# Patient Record
Sex: Male | Born: 1945
Health system: Southern US, Community
[De-identification: ages and names within clinical notes are randomized; demographics above are authoritative.]

## PROBLEM LIST (undated history)

## (undated) DIAGNOSIS — Z79899 Other long term (current) drug therapy: Secondary | ICD-10-CM

## (undated) DIAGNOSIS — N189 Chronic kidney disease, unspecified: Secondary | ICD-10-CM

## (undated) DIAGNOSIS — J189 Pneumonia, unspecified organism: Secondary | ICD-10-CM

## (undated) DIAGNOSIS — Z87442 Personal history of urinary calculi: Secondary | ICD-10-CM

## (undated) DIAGNOSIS — R509 Fever, unspecified: Secondary | ICD-10-CM

## (undated) DIAGNOSIS — R739 Hyperglycemia, unspecified: Secondary | ICD-10-CM

## (undated) DIAGNOSIS — M1990 Unspecified osteoarthritis, unspecified site: Secondary | ICD-10-CM

## (undated) DIAGNOSIS — E7889 Other lipoprotein metabolism disorders: Secondary | ICD-10-CM

## (undated) DIAGNOSIS — N400 Enlarged prostate without lower urinary tract symptoms: Secondary | ICD-10-CM

## (undated) HISTORY — DX: Benign prostatic hyperplasia without lower urinary tract symptoms: N40.0

## (undated) HISTORY — DX: Chronic kidney disease, unspecified: N18.9

## (undated) HISTORY — DX: Other long term (current) drug therapy: Z79.899

## (undated) HISTORY — PX: COLONOSCOPY: SHX174

## (undated) HISTORY — DX: Other lipoprotein metabolism disorders: E78.89

## (undated) HISTORY — DX: Hyperglycemia, unspecified: R73.9

## (undated) HISTORY — DX: Pneumonia, unspecified organism: J18.9

## (undated) HISTORY — DX: Fever, unspecified: R50.9

## (undated) MED FILL — Fosaprepitant Dimeglumine For IV Infusion 150 MG (Base Eq): INTRAVENOUS | Qty: 5 | Status: AC

## (undated) MED FILL — Fluorouracil IV Soln 5 GM/100ML (50 MG/ML): INTRAVENOUS | Qty: 148 | Status: AC

## (undated) MED FILL — Dexamethasone Sodium Phosphate Inj 100 MG/10ML: INTRAMUSCULAR | Qty: 1 | Status: AC

## (undated) MED FILL — Pembrolizumab IV Soln 100 MG/4ML (25 MG/ML): INTRAVENOUS | Qty: 8 | Status: AC

## (undated) MED FILL — Carboplatin IV Soln 600 MG/60ML: INTRAVENOUS | Qty: 45 | Status: AC

---

## 1967-03-10 HISTORY — PX: SHOULDER SURGERY: SHX246

## 2015-05-22 DIAGNOSIS — L299 Pruritus, unspecified: Secondary | ICD-10-CM | POA: Diagnosis not present

## 2015-05-22 DIAGNOSIS — L300 Nummular dermatitis: Secondary | ICD-10-CM | POA: Diagnosis not present

## 2016-02-27 DIAGNOSIS — Z6821 Body mass index (BMI) 21.0-21.9, adult: Secondary | ICD-10-CM | POA: Diagnosis not present

## 2016-02-27 DIAGNOSIS — R7309 Other abnormal glucose: Secondary | ICD-10-CM | POA: Diagnosis not present

## 2016-02-27 DIAGNOSIS — R6889 Other general symptoms and signs: Secondary | ICD-10-CM | POA: Diagnosis not present

## 2016-02-27 DIAGNOSIS — Z125 Encounter for screening for malignant neoplasm of prostate: Secondary | ICD-10-CM | POA: Diagnosis not present

## 2016-02-27 DIAGNOSIS — Z9181 History of falling: Secondary | ICD-10-CM | POA: Diagnosis not present

## 2016-02-27 DIAGNOSIS — J189 Pneumonia, unspecified organism: Secondary | ICD-10-CM | POA: Diagnosis not present

## 2016-03-05 DIAGNOSIS — J301 Allergic rhinitis due to pollen: Secondary | ICD-10-CM | POA: Diagnosis not present

## 2016-03-05 DIAGNOSIS — R7309 Other abnormal glucose: Secondary | ICD-10-CM | POA: Diagnosis not present

## 2016-03-05 DIAGNOSIS — Z6821 Body mass index (BMI) 21.0-21.9, adult: Secondary | ICD-10-CM | POA: Diagnosis not present

## 2016-03-05 DIAGNOSIS — E7889 Other lipoprotein metabolism disorders: Secondary | ICD-10-CM | POA: Diagnosis not present

## 2016-03-05 DIAGNOSIS — L309 Dermatitis, unspecified: Secondary | ICD-10-CM | POA: Diagnosis not present

## 2016-03-05 DIAGNOSIS — J019 Acute sinusitis, unspecified: Secondary | ICD-10-CM | POA: Diagnosis not present

## 2017-02-28 DIAGNOSIS — J0100 Acute maxillary sinusitis, unspecified: Secondary | ICD-10-CM | POA: Diagnosis not present

## 2017-02-28 DIAGNOSIS — J209 Acute bronchitis, unspecified: Secondary | ICD-10-CM | POA: Diagnosis not present

## 2017-05-09 DIAGNOSIS — J0100 Acute maxillary sinusitis, unspecified: Secondary | ICD-10-CM | POA: Diagnosis not present

## 2017-05-09 DIAGNOSIS — J040 Acute laryngitis: Secondary | ICD-10-CM | POA: Diagnosis not present

## 2017-05-19 DIAGNOSIS — J069 Acute upper respiratory infection, unspecified: Secondary | ICD-10-CM | POA: Diagnosis not present

## 2017-05-23 DIAGNOSIS — J209 Acute bronchitis, unspecified: Secondary | ICD-10-CM | POA: Diagnosis not present

## 2017-10-13 DIAGNOSIS — J324 Chronic pansinusitis: Secondary | ICD-10-CM | POA: Diagnosis not present

## 2017-12-03 DIAGNOSIS — R0789 Other chest pain: Secondary | ICD-10-CM | POA: Diagnosis not present

## 2017-12-05 DIAGNOSIS — R112 Nausea with vomiting, unspecified: Secondary | ICD-10-CM | POA: Diagnosis not present

## 2017-12-05 DIAGNOSIS — K8020 Calculus of gallbladder without cholecystitis without obstruction: Secondary | ICD-10-CM | POA: Diagnosis not present

## 2017-12-05 DIAGNOSIS — R918 Other nonspecific abnormal finding of lung field: Secondary | ICD-10-CM | POA: Diagnosis not present

## 2017-12-05 DIAGNOSIS — I714 Abdominal aortic aneurysm, without rupture: Secondary | ICD-10-CM | POA: Diagnosis not present

## 2017-12-05 DIAGNOSIS — I700 Atherosclerosis of aorta: Secondary | ICD-10-CM | POA: Diagnosis not present

## 2017-12-05 DIAGNOSIS — J189 Pneumonia, unspecified organism: Secondary | ICD-10-CM | POA: Diagnosis not present

## 2017-12-05 DIAGNOSIS — R197 Diarrhea, unspecified: Secondary | ICD-10-CM | POA: Diagnosis not present

## 2017-12-05 DIAGNOSIS — R7989 Other specified abnormal findings of blood chemistry: Secondary | ICD-10-CM | POA: Diagnosis not present

## 2017-12-07 DIAGNOSIS — Z9181 History of falling: Secondary | ICD-10-CM | POA: Diagnosis not present

## 2017-12-07 DIAGNOSIS — J181 Lobar pneumonia, unspecified organism: Secondary | ICD-10-CM | POA: Diagnosis not present

## 2017-12-07 DIAGNOSIS — Z1331 Encounter for screening for depression: Secondary | ICD-10-CM | POA: Diagnosis not present

## 2017-12-07 DIAGNOSIS — Z1339 Encounter for screening examination for other mental health and behavioral disorders: Secondary | ICD-10-CM | POA: Diagnosis not present

## 2017-12-07 DIAGNOSIS — I714 Abdominal aortic aneurysm, without rupture: Secondary | ICD-10-CM | POA: Diagnosis not present

## 2017-12-09 ENCOUNTER — Other Ambulatory Visit: Payer: Self-pay

## 2017-12-09 ENCOUNTER — Encounter: Payer: Self-pay | Admitting: Vascular Surgery

## 2017-12-09 ENCOUNTER — Encounter: Payer: Self-pay | Admitting: *Deleted

## 2017-12-09 ENCOUNTER — Ambulatory Visit (INDEPENDENT_AMBULATORY_CARE_PROVIDER_SITE_OTHER): Payer: Self-pay | Admitting: Vascular Surgery

## 2017-12-09 VITALS — BP 122/72 | HR 83 | Temp 97.8°F | Resp 18 | Ht 68.0 in | Wt 144.6 lb

## 2017-12-09 DIAGNOSIS — I714 Abdominal aortic aneurysm, without rupture, unspecified: Secondary | ICD-10-CM

## 2017-12-09 NOTE — H&P (View-Only) (Signed)
Referring Physician: Dr Wende Neighbors  Patient name: Cody Benjamin MRN: [id number] DOB: 07/01/1945 Sex: male  REASON FOR CONSULT: Abdominal aortic aneurysm HPI: Cody Benjamin is a 72 y.o. male, referred for evaluation of a 6.2 cm infrarenal abdominal aortic aneurysm.  The patient recently was seen at an urgent care in [place].  He was having chest pain at the time.  He was thought to have a pneumonia.  His pain got worse.  They subsequently did a chest CT which showed no evidence of any significant pulmonary process other than some chronic emphysema.  However, he was discovered to have an infrarenal abdominal aortic aneurysm.  This was not ruptured.  He currently does not have pain.  He has no family history of abdominal aortic aneurysm.  He is a former smoker but quit 4 years ago.  He denies claudication symptoms.  Past Medical History:  Diagnosis Date  . Chronic kidney disease    history of kidney stones  . Pneumonia    Past Surgical History:  Procedure Laterality Date  . SHOULDER SURGERY Right 1969    History reviewed. No pertinent family history.  SOCIAL HISTORY: Social History   Socioeconomic History  . Marital status: Single    Spouse name: Not on file  . Number of children: Not on file  . Years of education: Not on file  . Highest education level: Not on file  Occupational History  . Not on file  Social Needs  . Financial resource strain: Not on file  . Food insecurity:    Worry: Not on file    Inability: Not on file  . Transportation needs:    Medical: Not on file    Non-medical: Not on file  Tobacco Use  . Smoking status: Former Smoker    Last attempt to quit: 03/09/2013    Years since quitting: 4.7  . Smokeless tobacco: Never Used  Substance and Sexual Activity  . Alcohol use: Never    Frequency: Never  . Drug use: Never  . Sexual activity: Not on file  Lifestyle  . Physical activity:    Days per week: Not on file    Minutes per session: Not on file  .  Stress: Not on file  Relationships  . Social connections:    Talks on phone: Not on file    Gets together: Not on file    Attends religious service: Not on file    Active member of club or organization: Not on file    Attends meetings of clubs or organizations: Not on file    Relationship status: Not on file  . Intimate partner violence:    Fear of current or ex partner: Not on file    Emotionally abused: Not on file    Physically abused: Not on file    Forced sexual activity: Not on file  Other Topics Concern  . Not on file  Social History Narrative  . Not on file    No Known Allergies  Current Outpatient Medications  Medication Sig Dispense Refill  . doxycycline (VIBRAMYCIN) 100 MG capsule Take 100 mg by mouth 2 (two) times daily.     No current facility-administered medications for this visit.     ROS:   General:  No weight loss, Fever, chills  HEENT: No recent headaches, no nasal bleeding, no visual changes, no sore throat  Neurologic: No dizziness, blackouts, seizures. No recent symptoms of stroke or mini- stroke. No recent episodes of slurred  speech, or temporary blindness.  Cardiac: No recent episodes of chest pain/pressure, no shortness of breath at rest.  +shortness of breath with exertion.  Denies history of atrial fibrillation or irregular heartbeat  Vascular: No history of rest pain in feet.  No history of claudication.  No history of non-healing ulcer, No history of DVT   Pulmonary: No home oxygen, no productive cough, no hemoptysis,  No asthma or wheezing  Musculoskeletal:  [ ]  Arthritis, [ ]  Low back pain,  [ ]  Joint pain  Hematologic:No history of hypercoagulable state.  No history of easy bleeding.  No history of anemia  Gastrointestinal: No hematochezia or melena,  No gastroesophageal reflux, no trouble swallowing  Urinary: [ ]  chronic Kidney disease, [ ]  on HD - [ ]  MWF or [ ]  TTHS, [ ]  Burning with urination, [ ]  Frequent urination, [ ]  Difficulty  urinating;   Skin: No rashes  Psychological: No history of anxiety,  No history of depression   Physical Examination  Vitals:   12/09/17 1516  BP: 122/72  Pulse: 83  Resp: 18  Temp: 97.8 F (36.6 C)  TempSrc: Other (Comment)  SpO2: 97%  Weight: 144 lb 9.6 oz (65.6 kg)  Height: 5\' 8"  (1.727 m)    Body mass index is 21.99 kg/m.  General:  Alert and oriented, no acute distress HEENT: Normal Neck: No bruit or JVD Pulmonary: Clear to auscultation bilaterally Cardiac: Regular Rate and Rhythm without murmur Abdomen: Soft, non-tender, non-distended, pulsation palpable small diastases, no scars Skin: No rash Extremity Pulses:  2+ radial, brachial, 1+ femoral, 2+ right dorsalis pedis, less left posterior tibial pulses  Musculoskeletal: No deformity or edema  Neurologic: Upper and lower extremity motor 5/5 and symmetric  DATA:  I reviewed the images from the patient's recent CT scan of the abdomen and pelvis at [facility]  This shows a 6.2 cm infrarenal abdominal aortic aneurysm.  There seems to be adequate neck about 15 mm.  Neck diameter is about 24 mm.  The portions of the aneurysm visualized below the renal arteries are not opacified with contrast as the scan was done in 2 separate segments.  There is some calcification of the iliac arteries bilaterally.  There may be some narrowing of the iliac bifurcation bilaterally.  Common femoral arteries are about 11 to 12 mm in diameter with no significant calcification.  ASSESSMENT: Asymptomatic infrarenal 6.2 cm abdominal aortic aneurysm.  Discussed with patient today risk of rupture about 5 to 10 %/year.  Most likely this should be amenable to percutaneous stent graft repair although there may be some iliac occlusive disease.  He may need further imaging regarding this.   PLAN: #1 cardiology evaluation in [place] to make sure he can tolerate the operation  2.  Gore Excluder stent graft repair scheduled for Monday, December 27, 2017.  Risk benefits possible complications of procedure details including but not limited to bleeding infection vessel injury contrast reaction were discussed with the patient and his wife today.  He understands and agrees to proceed.  Ruta Hinds, MD [facility] Office: [phone] Pager: [phone]

## 2017-12-09 NOTE — Progress Notes (Signed)
Referring Physician: Dr Wende Neighbors  Patient name: Cody Benjamin MRN: [id number] DOB: 08/04/1945 Sex: male  REASON FOR CONSULT: Abdominal aortic aneurysm HPI: Cody Benjamin is a 72 y.o. male, referred for evaluation of a 6.2 cm infrarenal abdominal aortic aneurysm.  The patient recently was seen at an urgent care in [place].  He was having chest pain at the time.  He was thought to have a pneumonia.  His pain got worse.  They subsequently did a chest CT which showed no evidence of any significant pulmonary process other than some chronic emphysema.  However, he was discovered to have an infrarenal abdominal aortic aneurysm.  This was not ruptured.  He currently does not have pain.  He has no family history of abdominal aortic aneurysm.  He is a former smoker but quit 4 years ago.  He denies claudication symptoms.  Past Medical History:  Diagnosis Date  . Chronic kidney disease    history of kidney stones  . Pneumonia    Past Surgical History:  Procedure Laterality Date  . SHOULDER SURGERY Right 1969    History reviewed. No pertinent family history.  SOCIAL HISTORY: Social History   Socioeconomic History  . Marital status: Single    Spouse name: Not on file  . Number of children: Not on file  . Years of education: Not on file  . Highest education level: Not on file  Occupational History  . Not on file  Social Needs  . Financial resource strain: Not on file  . Food insecurity:    Worry: Not on file    Inability: Not on file  . Transportation needs:    Medical: Not on file    Non-medical: Not on file  Tobacco Use  . Smoking status: Former Smoker    Last attempt to quit: 03/09/2013    Years since quitting: 4.7  . Smokeless tobacco: Never Used  Substance and Sexual Activity  . Alcohol use: Never    Frequency: Never  . Drug use: Never  . Sexual activity: Not on file  Lifestyle  . Physical activity:    Days per week: Not on file    Minutes per session: Not on file  .  Stress: Not on file  Relationships  . Social connections:    Talks on phone: Not on file    Gets together: Not on file    Attends religious service: Not on file    Active member of club or organization: Not on file    Attends meetings of clubs or organizations: Not on file    Relationship status: Not on file  . Intimate partner violence:    Fear of current or ex partner: Not on file    Emotionally abused: Not on file    Physically abused: Not on file    Forced sexual activity: Not on file  Other Topics Concern  . Not on file  Social History Narrative  . Not on file    No Known Allergies  Current Outpatient Medications  Medication Sig Dispense Refill  . doxycycline (VIBRAMYCIN) 100 MG capsule Take 100 mg by mouth 2 (two) times daily.     No current facility-administered medications for this visit.     ROS:   General:  No weight loss, Fever, chills  HEENT: No recent headaches, no nasal bleeding, no visual changes, no sore throat  Neurologic: No dizziness, blackouts, seizures. No recent symptoms of stroke or mini- stroke. No recent episodes of slurred  speech, or temporary blindness.  Cardiac: No recent episodes of chest pain/pressure, no shortness of breath at rest.  +shortness of breath with exertion.  Denies history of atrial fibrillation or irregular heartbeat  Vascular: No history of rest pain in feet.  No history of claudication.  No history of non-healing ulcer, No history of DVT   Pulmonary: No home oxygen, no productive cough, no hemoptysis,  No asthma or wheezing  Musculoskeletal:  [ ]  Arthritis, [ ]  Low back pain,  [ ]  Joint pain  Hematologic:No history of hypercoagulable state.  No history of easy bleeding.  No history of anemia  Gastrointestinal: No hematochezia or melena,  No gastroesophageal reflux, no trouble swallowing  Urinary: [ ]  chronic Kidney disease, [ ]  on HD - [ ]  MWF or [ ]  TTHS, [ ]  Burning with urination, [ ]  Frequent urination, [ ]  Difficulty  urinating;   Skin: No rashes  Psychological: No history of anxiety,  No history of depression   Physical Examination  Vitals:   12/09/17 1516  BP: 122/72  Pulse: 83  Resp: 18  Temp: 97.8 F (36.6 C)  TempSrc: Other (Comment)  SpO2: 97%  Weight: 144 lb 9.6 oz (65.6 kg)  Height: 5\' 8"  (1.727 m)    Body mass index is 21.99 kg/m.  General:  Alert and oriented, no acute distress HEENT: Normal Neck: No bruit or JVD Pulmonary: Clear to auscultation bilaterally Cardiac: Regular Rate and Rhythm without murmur Abdomen: Soft, non-tender, non-distended, pulsation palpable small diastases, no scars Skin: No rash Extremity Pulses:  2+ radial, brachial, 1+ femoral, 2+ right dorsalis pedis, less left posterior tibial pulses  Musculoskeletal: No deformity or edema  Neurologic: Upper and lower extremity motor 5/5 and symmetric  DATA:  I reviewed the images from the patient's recent CT scan of the abdomen and pelvis at [facility].  This shows a 6.2 cm infrarenal abdominal aortic aneurysm.  There seems to be adequate neck about 15 mm.  Neck diameter is about 24 mm.  The portions of the aneurysm visualized below the renal arteries are not opacified with contrast as the scan was done in 2 separate segments.  There is some calcification of the iliac arteries bilaterally.  There may be some narrowing of the iliac bifurcation bilaterally.  Common femoral arteries are about 11 to 12 mm in diameter with no significant calcification.  ASSESSMENT: Asymptomatic infrarenal 6.2 cm abdominal aortic aneurysm.  Discussed with patient today risk of rupture about 5 to 10 %/year.  Most likely this should be amenable to percutaneous stent graft repair although there may be some iliac occlusive disease.  He may need further imaging regarding this.   PLAN: #1 cardiology evaluation in [place] to make sure he can tolerate the operation  2.  Gore Excluder stent graft repair scheduled for Monday, December 27, 2017.  Risk benefits possible complications of procedure details including but not limited to bleeding infection vessel injury contrast reaction were discussed with the patient and his wife today.  He understands and agrees to proceed.  Ruta Hinds, MD [facility] Office: [phone] Pager: [phone]

## 2017-12-10 ENCOUNTER — Other Ambulatory Visit: Payer: Self-pay | Admitting: *Deleted

## 2017-12-15 ENCOUNTER — Encounter: Payer: Self-pay | Admitting: Cardiology

## 2017-12-15 ENCOUNTER — Ambulatory Visit (INDEPENDENT_AMBULATORY_CARE_PROVIDER_SITE_OTHER): Payer: Self-pay | Admitting: Cardiology

## 2017-12-15 ENCOUNTER — Other Ambulatory Visit: Payer: Self-pay | Admitting: *Deleted

## 2017-12-15 VITALS — BP 126/80 | HR 102 | Ht 68.0 in | Wt 143.2 lb

## 2017-12-15 DIAGNOSIS — Z01810 Encounter for preprocedural cardiovascular examination: Secondary | ICD-10-CM

## 2017-12-15 DIAGNOSIS — Z452 Encounter for adjustment and management of vascular access device: Secondary | ICD-10-CM | POA: Insufficient documentation

## 2017-12-15 DIAGNOSIS — I714 Abdominal aortic aneurysm, without rupture, unspecified: Secondary | ICD-10-CM | POA: Insufficient documentation

## 2017-12-15 DIAGNOSIS — Z87891 Personal history of nicotine dependence: Secondary | ICD-10-CM

## 2017-12-15 HISTORY — DX: Personal history of nicotine dependence: Z87.891

## 2017-12-15 HISTORY — DX: Abdominal aortic aneurysm, without rupture: I71.4

## 2017-12-15 HISTORY — DX: Encounter for adjustment and management of vascular access device: Z45.2

## 2017-12-15 HISTORY — DX: Abdominal aortic aneurysm, without rupture, unspecified: I71.40

## 2017-12-15 MED ORDER — ATORVASTATIN CALCIUM 20 MG PO TABS: 20.0000 mg | ORAL_TABLET | Freq: Every day | ORAL | 2 refills | Status: DC

## 2017-12-15 NOTE — Progress Notes (Signed)
Cardiology Office Note:    Date:  12/15/2017   ID:  Cody Benjamin, DOB 1945/04/02, MRN [id number]  PCP:  Ocie Doyne., MD  Cardiologist:  Jenean Lindau, MD   Referring MD: Ocie Doyne., MD    ASSESSMENT:    1. AAA (abdominal aortic aneurysm) without rupture ([place])   2. Ex-cigarette smoker    PLAN:    In order of problems listed above:  1. Secondary prevention stressed with the patient.  Importance of compliance with diet and medication stressed and he vocalized understanding.  His blood pressure is stable. 2. I counseled him against smoking and vaping and told to refrain from it. 3. In view of his preop assessment for surgery which is a significant surgery I will set him up for a Lexiscan sestamibi.  If this is negative then he is not at high risk for coronary events during the aforementioned surgery.  Meticulous hemodynamic monitoring will further reduce the risk of coronary events.  Heart rate is elevated and I want to make sure that his thyroid function is within normal limits and we will review if he has had that blood work done if not we will do it.  I will also do a liver lipid check and initiate him on statin therapy in view of his aneurysm.  I Have advised him to take 81 mg coated aspirin daily after he checks with his surgeon.   Medication Adjustments/Labs and Tests Ordered: Current medicines are reviewed at length with the patient today.  Concerns regarding medicines are outlined above.  No orders of the defined types were placed in this encounter.  No orders of the defined types were placed in this encounter.    History of Present Illness:    Cody Benjamin is a 72 y.o. male who is being seen today for the evaluation of preop cardiovascular evaluation for abdominal aortic aneurysm surgery. At the request of Ocie Doyne., MD.  Patient is a pleasant 72 year old male.  He is accompanied by his wife for this visit.  He mentions to me that recently he had an upper  respiratory tract infection and pneumonitis and was treated at [facility].  He underwent evaluation with a chest x-ray and a CT scan and incidentally unfortunately a large abdominal aortic aneurysm was discovered on this evaluation incidentally.  The patient is seen a vascular surgeon and is being prepared for surgery.  He is infectious process seems to have improved and he is feeling much better.  He has no fever or chills or any cough at this time.  His chest x-ray and CT was abnormal and his primary care physician is following this for follow-up evaluation in a couple of weeks just to make sure there is no other process in the lungs.  Patient leads a sedentary lifestyle.  He denies any chest pain with his sedentary lifestyle.  No orthopnea or PND.  At the time of my evaluation, the patient is alert awake oriented and in no distress.   Past Medical History:  Diagnosis Date  . BPH (benign prostatic hyperplasia)   . Chronic kidney disease    history of kidney stones  . Drug therapy   . Elevated blood sugar   . Fever   . Lipids abnormal   . Pneumonia     Past Surgical History:  Procedure Laterality Date  . SHOULDER SURGERY Right 1969    Current Medications: No outpatient medications have been marked as taking for the  12/15/17 encounter (Office Visit) with Revankar, Reita Cliche, MD.     Allergies:   Patient has no known allergies.   Social History   Socioeconomic History  . Marital status: Married    Spouse name: Not on file  . Number of children: Not on file  . Years of education: Not on file  . Highest education level: Not on file  Occupational History  . Not on file  Social Needs  . Financial resource strain: Not on file  . Food insecurity:    Worry: Not on file    Inability: Not on file  . Transportation needs:    Medical: Not on file    Non-medical: Not on file  Tobacco Use  . Smoking status: Former Smoker    Last attempt to quit: 03/09/2013    Years since quitting:  4.7  . Smokeless tobacco: Never Used  Substance and Sexual Activity  . Alcohol use: Never    Frequency: Never  . Drug use: Never  . Sexual activity: Not on file  Lifestyle  . Physical activity:    Days per week: Not on file    Minutes per session: Not on file  . Stress: Not on file  Relationships  . Social connections:    Talks on phone: Not on file    Gets together: Not on file    Attends religious service: Not on file    Active member of club or organization: Not on file    Attends meetings of clubs or organizations: Not on file    Relationship status: Not on file  Other Topics Concern  . Not on file  Social History Narrative  . Not on file     Family History: The patient's family history includes Opal cancer in his mother; Seizures in his brother.  ROS:   Please see the history of present illness.    All other systems reviewed and are negative.  EKGs/Labs/Other Studies Reviewed:    The following studies were reviewed today: I reviewed [place] hospital records extensively and discussed with the patient.  EKG reveals sinus rhythm and nonspecific ST changes.  Infrarenal abdominal aortic aneurysm measures 5.6 x 5.2 cm in diameter.   Recent Labs: No results found for requested labs within last 8760 hours.  Recent Lipid Panel No results found for: CHOL, TRIG, HDL, CHOLHDL, VLDL, LDLCALC, LDLDIRECT  Physical Exam:    VS:  BP 126/80 (BP Location: Right Arm, Patient Position: Sitting, Cuff Size: Normal)   Pulse (!) 102   Ht 5\' 8"  (1.727 m)   Wt 143 lb 3.2 oz (65 kg)   SpO2 98%   BMI 21.77 kg/m     Wt Readings from Last 3 Encounters:  12/15/17 143 lb 3.2 oz (65 kg)  12/09/17 144 lb 9.6 oz (65.6 kg)     GEN: Patient is in no acute distress HEENT: Normal NECK: No JVD; No carotid bruits LYMPHATICS: No lymphadenopathy CARDIAC: S1 S2 regular, 2/6 systolic murmur at the apex. RESPIRATORY:  Clear to auscultation without rales, wheezing or rhonchi  ABDOMEN: Soft,  non-tender, non-distended MUSCULOSKELETAL:  No edema; No deformity  SKIN: Warm and dry NEUROLOGIC:  Alert and oriented x 3 PSYCHIATRIC:  Normal affect    Signed, Jenean Lindau, MD  12/15/2017 3:02 PM    [facility]

## 2017-12-15 NOTE — Patient Instructions (Signed)
Medication Instructions:  Your physician has recommended you make the following change in your medication:   START lipitor 20 mg daily  If you need a refill on your cardiac medications before your next appointment, please call your pharmacy.   Lab work: Your physician recommends that you have the following labs drawn: TSH and lipid panel to be done the same say of testing.  If you have labs (blood work) drawn today and your tests are completely normal, you will receive your results only by: [facility] Kitchen MyChart Message (if you have MyChart) OR . A paper copy in the mail If you have any lab test that is abnormal or we need to change your treatment, we will call you to review the results.  Testing/Procedures: Your physician has requested that you have an echocardiogram. Echocardiography is a painless test that uses sound waves to create images of your heart. It provides your doctor with information about the size and shape of your heart and how well your heart's chambers and valves are working. This procedure takes approximately one hour. There are no restrictions for this procedure.  Your physician has requested that you have a lexiscan myoview. For further information please visit [email or web address]. Please follow instruction sheet, as given.  Follow-Up: At [facility], you and your health needs are our priority.  As part of our continuing mission to provide you with exceptional heart care, we have created designated Provider Care Teams.  These Care Teams include your primary Cardiologist (physician) and Advanced Practice Providers (APPs -  Physician Assistants and Nurse Practitioners) who all work together to provide you with the care you need, when you need it.  You will need a follow up appointment in 3 months.  Please call our office 2 months in advance to schedule this appointment.  You may see another member of our [employer] Provider Team in [place]: Jenne Campus, MD . Shirlee More,  MD  Any Other Special Instructions Will Be Listed Below (If Applicable).   Please consult with your surgeon if you can take 81 mg enteric coated aspirin daily and notify our office.  [place], RN, BSN

## 2017-12-15 NOTE — Addendum Note (Signed)
Addended by: Mattie Marlin on: 12/15/2017 03:30 PM   Modules accepted: Orders

## 2017-12-16 ENCOUNTER — Ambulatory Visit
Admission: RE | Admit: 2017-12-16 | Discharge: 2017-12-16 | Disposition: A | Discharge status: Home / Self Care | Payer: PPO | Source: Ambulatory Visit | Attending: Vascular Surgery | Admitting: Vascular Surgery

## 2017-12-16 DIAGNOSIS — Z01818 Encounter for other preprocedural examination: Secondary | ICD-10-CM | POA: Diagnosis not present

## 2017-12-16 DIAGNOSIS — I714 Abdominal aortic aneurysm, without rupture, unspecified: Secondary | ICD-10-CM

## 2017-12-16 MED ORDER — IOPAMIDOL (ISOVUE-370) INJECTION 76%
75.0000 mL | Freq: Once | INTRAVENOUS | Status: AC | PRN
  Administered 2017-12-16: 75 mL via INTRAVENOUS

## 2017-12-20 ENCOUNTER — Encounter (HOSPITAL_COMMUNITY): Payer: Self-pay

## 2017-12-20 NOTE — Pre-Procedure Instructions (Signed)
Cody Benjamin  12/20/2017      [place], [address] [place] [address] Phone: [phone] Fax: [phone]    Your procedure is scheduled on Monday October 21st.  Report to [place] at [facility].  Call this number if you have problems the morning of surgery:  [phone]   Remember:  Do not eat or drink after midnight.    Take these medicines the morning of surgery with A SIP OF WATER   Tylenol (if needed)  Atorvastatin  Benedryl (if needed)  7 days prior to surgery STOP taking any Aspirin(unless otherwise instructed by your surgeon), Aleve, Naproxen, Ibuprofen, Motrin, Advil, Goody's, BC's, all herbal medications, fish oil, and all vitamins     Do not wear jewelry.  Do not wear lotions, powders, or colognes, or deodorant.  Men may shave face and neck.  Do not bring valuables to the hospital.  [facility] is not responsible for any belongings or valuables.  Contacts, dentures or bridgework may not be worn into surgery.  Leave your suitcase in the car.  After surgery it may be brought to your room.  For patients admitted to the hospital, discharge time will be determined by your treatment team.  Patients discharged the day of surgery will not be allowed to drive home.    [place]- Preparing For Surgery  Before surgery, you can play an important role. Because skin is not sterile, your skin needs to be as free of germs as possible. You can reduce the number of germs on your skin by washing with CHG (chlorahexidine gluconate) Soap before surgery.  CHG is an antiseptic cleaner which kills germs and bonds with the skin to continue killing germs even after washing.    Oral Hygiene is also important to reduce your risk of infection.  Remember - BRUSH YOUR TEETH THE MORNING OF SURGERY WITH YOUR REGULAR TOOTHPASTE  Please do not use if you have an allergy to CHG or antibacterial  soaps. If your skin becomes reddened/irritated stop using the CHG.  Do not shave (including legs and underarms) for at least 48 hours prior to first CHG shower. It is OK to shave your face.  Please follow these instructions carefully.   1. Shower the NIGHT BEFORE SURGERY and the MORNING OF SURGERY with CHG.   2. If you chose to wash your hair, wash your hair first as usual with your normal shampoo.  3. After you shampoo, rinse your hair and body thoroughly to remove the shampoo.  4. Use CHG as you would any other liquid soap. You can apply CHG directly to the skin and wash gently with a scrungie or a clean washcloth.   5. Apply the CHG Soap to your body ONLY FROM THE NECK DOWN.  Do not use on open wounds or open sores. Avoid contact with your eyes, ears, mouth and genitals (private parts). Wash Face and genitals (private parts)  with your normal soap.  6. Wash thoroughly, paying special attention to the area where your surgery will be performed.  7. Thoroughly rinse your body with warm water from the neck down.  8. DO NOT shower/wash with your normal soap after using and rinsing off the CHG Soap.  9. Pat yourself dry with a CLEAN TOWEL.  10. Wear CLEAN PAJAMAS to bed the night before surgery, wear comfortable clothes the morning of surgery  11.  Place CLEAN SHEETS on your bed the night of your first shower and DO NOT SLEEP WITH PETS.    Day of Surgery:  Do not apply any deodorants/lotions.  Please wear clean clothes to the hospital/surgery center.   Remember to brush your teeth WITH YOUR REGULAR TOOTHPASTE.    Please read over the following fact sheets that you were given.

## 2017-12-21 ENCOUNTER — Other Ambulatory Visit: Payer: Self-pay

## 2017-12-21 ENCOUNTER — Encounter (HOSPITAL_COMMUNITY)
Admission: RE | Admit: 2017-12-21 | Discharge: 2017-12-21 | Disposition: A | Discharge status: Home / Self Care | Payer: PPO | Source: Ambulatory Visit | Attending: Vascular Surgery | Admitting: Vascular Surgery

## 2017-12-21 ENCOUNTER — Encounter (HOSPITAL_COMMUNITY): Payer: Self-pay

## 2017-12-21 DIAGNOSIS — N400 Enlarged prostate without lower urinary tract symptoms: Secondary | ICD-10-CM | POA: Diagnosis not present

## 2017-12-21 DIAGNOSIS — Z01812 Encounter for preprocedural laboratory examination: Secondary | ICD-10-CM | POA: Diagnosis not present

## 2017-12-21 DIAGNOSIS — Z87442 Personal history of urinary calculi: Secondary | ICD-10-CM | POA: Insufficient documentation

## 2017-12-21 DIAGNOSIS — I714 Abdominal aortic aneurysm, without rupture: Secondary | ICD-10-CM | POA: Insufficient documentation

## 2017-12-21 DIAGNOSIS — J439 Emphysema, unspecified: Secondary | ICD-10-CM | POA: Diagnosis not present

## 2017-12-21 DIAGNOSIS — Z79899 Other long term (current) drug therapy: Secondary | ICD-10-CM | POA: Diagnosis not present

## 2017-12-21 DIAGNOSIS — Z87891 Personal history of nicotine dependence: Secondary | ICD-10-CM | POA: Diagnosis not present

## 2017-12-21 DIAGNOSIS — I700 Atherosclerosis of aorta: Secondary | ICD-10-CM | POA: Insufficient documentation

## 2017-12-21 HISTORY — DX: Unspecified osteoarthritis, unspecified site: M19.90

## 2017-12-21 HISTORY — DX: Personal history of urinary calculi: Z87.442

## 2017-12-21 LAB — ABO/RH: ABO/RH(D): O NEG

## 2017-12-21 LAB — COMPREHENSIVE METABOLIC PANEL
ALBUMIN: 3.7 g/dL (ref 3.5–5.0)
ALT: 14 U/L (ref 0–44)
ANION GAP: 9 (ref 5–15)
AST: 18 U/L (ref 15–41)
Alkaline Phosphatase: 76 U/L (ref 38–126)
BUN: 9 mg/dL (ref 8–23)
CALCIUM: 9.7 mg/dL (ref 8.9–10.3)
CHLORIDE: 106 mmol/L (ref 98–111)
CO2: 25 mmol/L (ref 22–32)
Creatinine, Ser: 1.26 mg/dL — ABNORMAL HIGH (ref 0.61–1.24)
GFR calc non Af Amer: 56 mL/min — ABNORMAL LOW (ref >60)
GLUCOSE: 88 mg/dL (ref 70–99)
POTASSIUM: 4.4 mmol/L (ref 3.5–5.1)
SODIUM: 140 mmol/L (ref 135–145)
Total Bilirubin: 0.4 mg/dL (ref 0.3–1.2)
Total Protein: 7.2 g/dL (ref 6.5–8.1)

## 2017-12-21 LAB — CBC
HCT: 45.3 % (ref 39.0–52.0)
Hemoglobin: 14.4 g/dL (ref 13.0–17.0)
MCH: 29.3 pg (ref 26.0–34.0)
MCHC: 31.8 g/dL (ref 30.0–36.0)
MCV: 92.3 fL (ref 80.0–100.0)
PLATELETS: 370 10*3/uL (ref 150–400)
RBC: 4.91 MIL/uL (ref 4.22–5.81)
RDW: 11.9 % (ref 11.5–15.5)
WBC: 5.4 10*3/uL (ref 4.0–10.5)
nRBC: 0 % (ref 0.0–0.2)

## 2017-12-21 LAB — URINALYSIS, ROUTINE W REFLEX MICROSCOPIC
BACTERIA UA: NONE SEEN
Bilirubin Urine: NEGATIVE
Glucose, UA: NEGATIVE mg/dL
Hgb urine dipstick: NEGATIVE
KETONES UR: NEGATIVE mg/dL
Leukocytes, UA: NEGATIVE
Nitrite: NEGATIVE
PH: 6 (ref 5.0–8.0)
Protein, ur: NEGATIVE mg/dL
Specific Gravity, Urine: 1.005 (ref 1.005–1.030)

## 2017-12-21 LAB — APTT: APTT: 31 s (ref 24–36)

## 2017-12-21 LAB — TYPE AND SCREEN
ABO/RH(D): O NEG
Antibody Screen: NEGATIVE

## 2017-12-21 LAB — SURGICAL PCR SCREEN
MRSA, PCR: NEGATIVE
Staphylococcus aureus: NEGATIVE

## 2017-12-21 LAB — PROTIME-INR
INR: 1.13
Prothrombin Time: 14.4 seconds (ref 11.4–15.2)

## 2017-12-21 NOTE — Progress Notes (Signed)
PCP -  Wende Neighbors MD Cardiologist -  Dr. Geraldo Pitter MD  Chest x-ray - 12/05/17 Requested EKG - 12/05/17 Requested ECHO - will be done friday  Blood Thinner Instructions: N/A Aspirin Instructions: N/A  Anesthesia review: yes, requested documents from [facility]  Patient denies shortness of breath, fever, cough and chest pain at PAT appointment   Patient verbalized understanding of instructions that were given to them at the PAT appointment. Patient was also instructed that they will need to review over the PAT instructions again at home before surgery.

## 2017-12-22 ENCOUNTER — Encounter: Payer: Self-pay | Admitting: Vascular Surgery

## 2017-12-22 NOTE — Progress Notes (Addendum)
Anesthesia Chart Review:  Case:  [id number] Date/Time:  12/27/17 0715   Procedure:  ABDOMINAL AORTIC ENDOVASCULAR STENT GRAFT (N/A )   Anesthesia type:  General   Pre-op diagnosis:  ABDOMINAL AORTIC ANEURYSM   Location:  [facility] OR ROOM 14 / [place] OR   Surgeon:  Elam Dutch, MD      DISCUSSION: Patient is 72 year old male scheduled for the above procedure.  History includes former smoker (quit '15), AAA. AAA was an incidental finding on 12/05/17 CTA chest (negative for PE) during ED evaluation for SOB with RUL PNA. He was referred to vascular surgery with CTA abd/pelvis confirming a 6.5 cm infrarenal AAA.   Patient was sent to Dr. Geraldo Pitter for preoperative cardiology evaluation with stress/echo ordered. He denied chest pain, SOB, cough, fever at PAT. Chart will be left for echo and stress test results (scheduled at [facility] on 12/24/17).  Addendum 12/24/17: Pt had stress cardiolite at [facility] today.  Showed no significant reversible ischemia.  Normal left ventricular wall function.  Left ventricular ejection fraction 69%.  Noninvasive risk stratification: Low.  Dr. Bettina Gavia commented "normal or stable result."  Anticipate he can proceed with surgery as planned.  VS: BP 130/80   Pulse 95   Temp 36.4 C   Resp 18   Ht 5\' 8"  (1.727 m)   Wt 64.9 kg   SpO2 100%   BMI 21.76 kg/m   PROVIDERS: Ocie Doyne., MD is PCP Jyl Heinz, MD is cardiologist   LABS: Labs reviewed: Acceptable for surgery. (all labs ordered are listed, but only abnormal results are displayed)  Labs Reviewed  COMPREHENSIVE METABOLIC PANEL - Abnormal; Notable for the following components:      Result Value   Creatinine, Ser 1.26 (*)    GFR calc non Af Amer 56 (*)    All other components within normal limits  SURGICAL PCR SCREEN  APTT  CBC  PROTIME-INR  URINALYSIS, ROUTINE W REFLEX MICROSCOPIC  TYPE AND SCREEN  ABO/RH    IMAGES: CTA abd/pelvis 12/16/17: IMPRESSION: VASCULAR 1. 6.5 cm infrarenal  abdominal aortic aneurysm without rupture. Aneurysm terminates above the aortic bifurcation. Aortic aneurysm NOS (ICD10-I71.9). 2. Single bilateral renal arteries. 3. Variant hepatic arterial anatomy with replaced left and right hepatic arteries as described. 4. Tortuous external iliac arteries, particularly on the right side. NON-VASCULAR 1. No acute abnormality in the abdomen or pelvis. 2. Prominent central mesenteric lymph node measuring up to 1.7 cm. This finding is nonspecific and recommend attention to this area on follow up imaging. 3. Cholelithiasis or gallbladder sludge. 4. Aortic Atherosclerosis (ICD10-I70.0) and Emphysema (ICD10-J43.9). 5. Prostate enlargement.  1V CXR 12/05/17 ([facility]: ED visit for SOB): FINDINGS: There is infiltrate in the periphery of the right upper lobe consistent with history of pneumonia.  Heart, hila, mediastinum, lungs, and pleura are otherwise unremarkable. IMPRESSION: Right upper lobe infiltrate.  Recommend follow-up to resolution.   EKG: 12/05/17 [facility]): ST at 102 bpm, PACs    CV: Nuclear stress test 12/24/17 [facility]): PENDING.  Echo 12/24/17 [facility]): PENDING.    Past Medical History:  Diagnosis Date  . Arthritis    right shoulder  . BPH (benign prostatic hyperplasia)   . Chronic kidney disease    history of kidney stones  . Drug therapy   . Elevated blood sugar   . Fever   . History of kidney stones   . Lipids abnormal   . Pneumonia     Past Surgical History:  Procedure Laterality Date  . COLONOSCOPY    . SHOULDER SURGERY Right 1969    MEDICATIONS: . acetaminophen (TYLENOL) 500 MG tablet  . atorvastatin (LIPITOR) 20 MG tablet  . diphenhydrAMINE (BENADRYL) 25 mg capsule   No current facility-administered medications for this encounter.     George Hugh [facility] Short Stay Center/Anesthesiology Phone [phone] 12/22/2017 11:18 AM

## 2017-12-24 DIAGNOSIS — Z01810 Encounter for preprocedural cardiovascular examination: Secondary | ICD-10-CM | POA: Diagnosis not present

## 2017-12-24 DIAGNOSIS — I358 Other nonrheumatic aortic valve disorders: Secondary | ICD-10-CM | POA: Diagnosis not present

## 2017-12-24 DIAGNOSIS — Z01818 Encounter for other preprocedural examination: Secondary | ICD-10-CM | POA: Diagnosis not present

## 2017-12-24 LAB — LIPID PANEL
Chol/HDL Ratio: 4.6 ratio (ref 0.0–5.0)
Cholesterol, Total: 172 mg/dL (ref 100–199)
HDL: 37 mg/dL — AB (ref >39)
LDL Calculated: 111 mg/dL — ABNORMAL HIGH (ref 0–99)
TRIGLYCERIDES: 122 mg/dL (ref 0–149)
VLDL Cholesterol Cal: 24 mg/dL (ref 5–40)

## 2017-12-24 LAB — TSH: TSH: 3.48 u[IU]/mL (ref 0.450–4.500)

## 2017-12-27 ENCOUNTER — Inpatient Hospital Stay (HOSPITAL_COMMUNITY): Payer: PPO | Admitting: Vascular Surgery

## 2017-12-27 ENCOUNTER — Inpatient Hospital Stay (HOSPITAL_COMMUNITY): Payer: PPO

## 2017-12-27 ENCOUNTER — Encounter (HOSPITAL_COMMUNITY): Payer: Self-pay

## 2017-12-27 ENCOUNTER — Other Ambulatory Visit: Payer: Self-pay

## 2017-12-27 ENCOUNTER — Inpatient Hospital Stay (HOSPITAL_COMMUNITY)
Admission: RE | Admit: 2017-12-27 | Discharge: 2017-12-28 | DRG: 269 | Disposition: A | Discharge status: Home / Self Care | Payer: PPO | Attending: Vascular Surgery | Admitting: Vascular Surgery

## 2017-12-27 ENCOUNTER — Encounter (HOSPITAL_COMMUNITY)
Admission: RE | Disposition: A | Discharge status: Home / Self Care | Payer: Self-pay | Source: Home / Self Care | Attending: Vascular Surgery

## 2017-12-27 DIAGNOSIS — I714 Abdominal aortic aneurysm, without rupture, unspecified: Secondary | ICD-10-CM

## 2017-12-27 DIAGNOSIS — Z23 Encounter for immunization: Secondary | ICD-10-CM | POA: Diagnosis not present

## 2017-12-27 DIAGNOSIS — N400 Enlarged prostate without lower urinary tract symptoms: Secondary | ICD-10-CM | POA: Diagnosis not present

## 2017-12-27 DIAGNOSIS — Z87891 Personal history of nicotine dependence: Secondary | ICD-10-CM | POA: Diagnosis not present

## 2017-12-27 DIAGNOSIS — Z87442 Personal history of urinary calculi: Secondary | ICD-10-CM

## 2017-12-27 DIAGNOSIS — J439 Emphysema, unspecified: Secondary | ICD-10-CM | POA: Diagnosis not present

## 2017-12-27 DIAGNOSIS — I959 Hypotension, unspecified: Secondary | ICD-10-CM | POA: Diagnosis not present

## 2017-12-27 DIAGNOSIS — Z792 Long term (current) use of antibiotics: Secondary | ICD-10-CM

## 2017-12-27 DIAGNOSIS — Z9889 Other specified postprocedural states: Secondary | ICD-10-CM

## 2017-12-27 DIAGNOSIS — M19011 Primary osteoarthritis, right shoulder: Secondary | ICD-10-CM | POA: Diagnosis present

## 2017-12-27 DIAGNOSIS — Z8701 Personal history of pneumonia (recurrent): Secondary | ICD-10-CM

## 2017-12-27 HISTORY — PX: ABDOMINAL AORTIC ENDOVASCULAR STENT GRAFT: SHX5707

## 2017-12-27 HISTORY — DX: Abdominal aortic aneurysm, without rupture: I71.4

## 2017-12-27 HISTORY — DX: Abdominal aortic aneurysm, without rupture, unspecified: I71.40

## 2017-12-27 LAB — BASIC METABOLIC PANEL
ANION GAP: 5 (ref 5–15)
BUN: 8 mg/dL (ref 8–23)
CALCIUM: 8 mg/dL — AB (ref 8.9–10.3)
CHLORIDE: 113 mmol/L — AB (ref 98–111)
CO2: 24 mmol/L (ref 22–32)
Creatinine, Ser: 1.02 mg/dL (ref 0.61–1.24)
GFR calc non Af Amer: >60 mL/min (ref >60)
GLUCOSE: 121 mg/dL — AB (ref 70–99)
POTASSIUM: 3.8 mmol/L (ref 3.5–5.1)
Sodium: 142 mmol/L (ref 135–145)

## 2017-12-27 LAB — POCT ACTIVATED CLOTTING TIME
ACTIVATED CLOTTING TIME: 202 s
Activated Clotting Time: 263 seconds
Activated Clotting Time: 125 seconds

## 2017-12-27 LAB — CBC
HEMATOCRIT: 33.4 % — AB (ref 39.0–52.0)
HEMOGLOBIN: 10.5 g/dL — AB (ref 13.0–17.0)
MCH: 28.5 pg (ref 26.0–34.0)
MCHC: 31.4 g/dL (ref 30.0–36.0)
MCV: 90.8 fL (ref 80.0–100.0)
NRBC: 0 % (ref 0.0–0.2)
Platelets: 202 10*3/uL (ref 150–400)
RBC: 3.68 MIL/uL — ABNORMAL LOW (ref 4.22–5.81)
RDW: 12.1 % (ref 11.5–15.5)
WBC: 8.3 10*3/uL (ref 4.0–10.5)

## 2017-12-27 LAB — APTT: APTT: 34 s (ref 24–36)

## 2017-12-27 LAB — PROTIME-INR
INR: 1.35
Prothrombin Time: 16.6 seconds — ABNORMAL HIGH (ref 11.4–15.2)

## 2017-12-27 LAB — MAGNESIUM: MAGNESIUM: 1.6 mg/dL — AB (ref 1.7–2.4)

## 2017-12-27 SURGERY — INSERTION, ENDOVASCULAR STENT GRAFT, AORTA, ABDOMINAL
Anesthesia: General

## 2017-12-27 MED ORDER — ONDANSETRON HCL 4 MG/2ML IJ SOLN
INTRAMUSCULAR | Status: DC | PRN
  Administered 2017-12-27: 4 mg via INTRAVENOUS

## 2017-12-27 MED ORDER — HEPARIN SODIUM (PORCINE) 1000 UNIT/ML IJ SOLN
INTRAMUSCULAR | Status: DC | PRN
  Administered 2017-12-27: 5000 [IU] via INTRAVENOUS
  Administered 2017-12-27: 7000 [IU] via INTRAVENOUS

## 2017-12-27 MED ORDER — HYDRALAZINE HCL 20 MG/ML IJ SOLN: 5.0000 mg | INTRAMUSCULAR | Status: DC | PRN

## 2017-12-27 MED ORDER — ATORVASTATIN CALCIUM 20 MG PO TABS
20.0000 mg | ORAL_TABLET | Freq: Every day | ORAL | Status: DC
  Administered 2017-12-27: 20 mg via ORAL
  Filled 2017-12-27: qty 1

## 2017-12-27 MED ORDER — SODIUM CHLORIDE 0.9 % IV SOLN: INTRAVENOUS | Status: DC

## 2017-12-27 MED ORDER — ENOXAPARIN SODIUM 30 MG/0.3ML ~~LOC~~ SOLN: 30.0000 mg | SUBCUTANEOUS | Status: DC

## 2017-12-27 MED ORDER — ROCURONIUM BROMIDE 50 MG/5ML IV SOSY
PREFILLED_SYRINGE | INTRAVENOUS | Status: DC | PRN
  Administered 2017-12-27: 10 mg via INTRAVENOUS
  Administered 2017-12-27: 40 mg via INTRAVENOUS
  Administered 2017-12-27: 10 mg via INTRAVENOUS
  Administered 2017-12-27: 50 mg via INTRAVENOUS

## 2017-12-27 MED ORDER — PROPOFOL 10 MG/ML IV BOLUS
INTRAVENOUS | Status: AC
  Filled 2017-12-27: qty 20

## 2017-12-27 MED ORDER — MAGNESIUM SULFATE 2 GM/50ML IV SOLN: 2.0000 g | Freq: Every day | INTRAVENOUS | Status: DC | PRN

## 2017-12-27 MED ORDER — OXYCODONE HCL 5 MG PO TABS: 5.0000 mg | ORAL_TABLET | Freq: Once | ORAL | Status: DC | PRN

## 2017-12-27 MED ORDER — ROCURONIUM BROMIDE 50 MG/5ML IV SOSY
PREFILLED_SYRINGE | INTRAVENOUS | Status: AC
  Filled 2017-12-27: qty 15

## 2017-12-27 MED ORDER — LACTATED RINGERS IV SOLN
INTRAVENOUS | Status: DC | PRN
  Administered 2017-12-27 (×2): via INTRAVENOUS

## 2017-12-27 MED ORDER — LABETALOL HCL 5 MG/ML IV SOLN: 10.0000 mg | INTRAVENOUS | Status: DC | PRN

## 2017-12-27 MED ORDER — ONDANSETRON HCL 4 MG/2ML IJ SOLN: 4.0000 mg | Freq: Once | INTRAMUSCULAR | Status: DC | PRN

## 2017-12-27 MED ORDER — ALUM & MAG HYDROXIDE-SIMETH 200-200-20 MG/5ML PO SUSP: 15.0000 mL | ORAL | Status: DC | PRN

## 2017-12-27 MED ORDER — PNEUMOCOCCAL VAC POLYVALENT 25 MCG/0.5ML IJ INJ
0.5000 mL | INJECTION | INTRAMUSCULAR | Status: AC
  Administered 2017-12-28: 0.5 mL via INTRAMUSCULAR
  Filled 2017-12-27: qty 0.5

## 2017-12-27 MED ORDER — BISACODYL 5 MG PO TBEC: 5.0000 mg | DELAYED_RELEASE_TABLET | Freq: Every day | ORAL | Status: DC | PRN

## 2017-12-27 MED ORDER — SENNOSIDES-DOCUSATE SODIUM 8.6-50 MG PO TABS: 1.0000 | ORAL_TABLET | Freq: Every evening | ORAL | Status: DC | PRN

## 2017-12-27 MED ORDER — PANTOPRAZOLE SODIUM 40 MG PO TBEC
40.0000 mg | DELAYED_RELEASE_TABLET | Freq: Every day | ORAL | Status: DC
  Administered 2017-12-27 – 2017-12-28 (×2): 40 mg via ORAL
  Filled 2017-12-27 (×2): qty 1

## 2017-12-27 MED ORDER — HEPARIN SODIUM (PORCINE) 1000 UNIT/ML IJ SOLN
INTRAMUSCULAR | Status: AC
  Filled 2017-12-27: qty 2

## 2017-12-27 MED ORDER — SODIUM CHLORIDE 0.9 % IV SOLN
INTRAVENOUS | Status: AC
  Filled 2017-12-27: qty 1.2

## 2017-12-27 MED ORDER — POTASSIUM CHLORIDE CRYS ER 20 MEQ PO TBCR: 20.0000 meq | EXTENDED_RELEASE_TABLET | Freq: Every day | ORAL | Status: DC | PRN

## 2017-12-27 MED ORDER — CEFAZOLIN SODIUM-DEXTROSE 2-4 GM/100ML-% IV SOLN
2.0000 g | Freq: Three times a day (TID) | INTRAVENOUS | Status: AC
  Administered 2017-12-27 (×2): 2 g via INTRAVENOUS
  Filled 2017-12-27 (×2): qty 100

## 2017-12-27 MED ORDER — DIPHENHYDRAMINE HCL 25 MG PO CAPS: 25.0000 mg | ORAL_CAPSULE | Freq: Every day | ORAL | Status: DC | PRN

## 2017-12-27 MED ORDER — PROTAMINE SULFATE 10 MG/ML IV SOLN
INTRAVENOUS | Status: DC | PRN
  Administered 2017-12-27 (×2): 10 mg via INTRAVENOUS

## 2017-12-27 MED ORDER — ACETAMINOPHEN 325 MG RE SUPP: 325.0000 mg | RECTAL | Status: DC | PRN

## 2017-12-27 MED ORDER — INFLUENZA VAC SPLIT HIGH-DOSE 0.5 ML IM SUSY
0.5000 mL | PREFILLED_SYRINGE | INTRAMUSCULAR | Status: DC
  Filled 2017-12-27 (×2): qty 0.5

## 2017-12-27 MED ORDER — HEPARIN SODIUM (PORCINE) 1000 UNIT/ML IJ SOLN
INTRAMUSCULAR | Status: AC
  Filled 2017-12-27: qty 1

## 2017-12-27 MED ORDER — METOPROLOL TARTRATE 5 MG/5ML IV SOLN: 2.0000 mg | INTRAVENOUS | Status: DC | PRN

## 2017-12-27 MED ORDER — CHLORHEXIDINE GLUCONATE 4 % EX LIQD: 60.0000 mL | Freq: Once | CUTANEOUS | Status: DC

## 2017-12-27 MED ORDER — DOCUSATE SODIUM 100 MG PO CAPS
100.0000 mg | ORAL_CAPSULE | Freq: Every day | ORAL | Status: DC
  Administered 2017-12-28: 100 mg via ORAL
  Filled 2017-12-27: qty 1

## 2017-12-27 MED ORDER — ESMOLOL HCL 100 MG/10ML IV SOLN
INTRAVENOUS | Status: DC | PRN
  Administered 2017-12-27: 20 ug via INTRAVENOUS
  Administered 2017-12-27 (×2): 10 ug via INTRAVENOUS
  Administered 2017-12-27: 20 ug via INTRAVENOUS

## 2017-12-27 MED ORDER — ACETAMINOPHEN 325 MG PO TABS: 325.0000 mg | ORAL_TABLET | ORAL | Status: DC | PRN

## 2017-12-27 MED ORDER — ROCURONIUM BROMIDE 50 MG/5ML IV SOSY
PREFILLED_SYRINGE | INTRAVENOUS | Status: AC
  Filled 2017-12-27: qty 5

## 2017-12-27 MED ORDER — PHENYLEPHRINE 40 MCG/ML (10ML) SYRINGE FOR IV PUSH (FOR BLOOD PRESSURE SUPPORT)
PREFILLED_SYRINGE | INTRAVENOUS | Status: DC | PRN
  Administered 2017-12-27: 80 ug via INTRAVENOUS
  Administered 2017-12-27: 120 ug via INTRAVENOUS
  Administered 2017-12-27: 40 ug via INTRAVENOUS
  Administered 2017-12-27: 80 ug via INTRAVENOUS

## 2017-12-27 MED ORDER — FENTANYL CITRATE (PF) 100 MCG/2ML IJ SOLN
INTRAMUSCULAR | Status: DC | PRN
  Administered 2017-12-27: 50 ug via INTRAVENOUS
  Administered 2017-12-27: 150 ug via INTRAVENOUS

## 2017-12-27 MED ORDER — SODIUM CHLORIDE 0.9 % IV SOLN
INTRAVENOUS | Status: DC | PRN
  Administered 2017-12-27: 08:00:00

## 2017-12-27 MED ORDER — SODIUM CHLORIDE 0.9 % IV SOLN
INTRAVENOUS | Status: DC
  Administered 2017-12-27: 13:00:00 via INTRAVENOUS

## 2017-12-27 MED ORDER — OXYCODONE HCL 5 MG PO TABS
5.0000 mg | ORAL_TABLET | ORAL | Status: DC | PRN
  Administered 2017-12-27 (×2): 10 mg via ORAL
  Administered 2017-12-27: 5 mg via ORAL
  Administered 2017-12-28 (×2): 10 mg via ORAL
  Filled 2017-12-27 (×3): qty 2
  Filled 2017-12-27: qty 1
  Filled 2017-12-27: qty 2

## 2017-12-27 MED ORDER — PROPOFOL 10 MG/ML IV BOLUS
INTRAVENOUS | Status: DC | PRN
  Administered 2017-12-27: 90 mg via INTRAVENOUS

## 2017-12-27 MED ORDER — OXYCODONE HCL 5 MG/5ML PO SOLN: 5.0000 mg | Freq: Once | ORAL | Status: DC | PRN

## 2017-12-27 MED ORDER — MIDAZOLAM HCL 2 MG/2ML IJ SOLN
INTRAMUSCULAR | Status: AC
  Filled 2017-12-27: qty 2

## 2017-12-27 MED ORDER — ONDANSETRON HCL 4 MG/2ML IJ SOLN
4.0000 mg | Freq: Four times a day (QID) | INTRAMUSCULAR | Status: DC | PRN
  Administered 2017-12-27: 4 mg via INTRAVENOUS
  Filled 2017-12-27: qty 2

## 2017-12-27 MED ORDER — FENTANYL CITRATE (PF) 250 MCG/5ML IJ SOLN
INTRAMUSCULAR | Status: AC
  Filled 2017-12-27: qty 5

## 2017-12-27 MED ORDER — SODIUM CHLORIDE 0.9 % IV SOLN
INTRAVENOUS | Status: DC | PRN
  Administered 2017-12-27: 30 ug/min via INTRAVENOUS

## 2017-12-27 MED ORDER — SODIUM CHLORIDE 0.9 % IV SOLN
500.0000 mL | Freq: Once | INTRAVENOUS | Status: AC | PRN
  Administered 2017-12-27 (×2): 500 mL via INTRAVENOUS

## 2017-12-27 MED ORDER — IODIXANOL 320 MG/ML IV SOLN
INTRAVENOUS | Status: DC | PRN
  Administered 2017-12-27: 20 mL via INTRAVENOUS
  Administered 2017-12-27: 150 mL via INTRAVENOUS

## 2017-12-27 MED ORDER — SUGAMMADEX SODIUM 200 MG/2ML IV SOLN
INTRAVENOUS | Status: DC | PRN
  Administered 2017-12-27: 130 mg via INTRAVENOUS

## 2017-12-27 MED ORDER — LABETALOL HCL 5 MG/ML IV SOLN
INTRAVENOUS | Status: DC | PRN
  Administered 2017-12-27: 10 mg via INTRAVENOUS

## 2017-12-27 MED ORDER — GUAIFENESIN-DM 100-10 MG/5ML PO SYRP: 15.0000 mL | ORAL_SOLUTION | ORAL | Status: DC | PRN

## 2017-12-27 MED ORDER — MORPHINE SULFATE (PF) 2 MG/ML IV SOLN: 1.0000 mg | INTRAVENOUS | Status: DC | PRN

## 2017-12-27 MED ORDER — LIDOCAINE 2% (20 MG/ML) 5 ML SYRINGE
INTRAMUSCULAR | Status: AC
  Filled 2017-12-27: qty 5

## 2017-12-27 MED ORDER — CEFAZOLIN SODIUM-DEXTROSE 2-4 GM/100ML-% IV SOLN
2.0000 g | INTRAVENOUS | Status: AC
  Administered 2017-12-27: 2 g via INTRAVENOUS
  Filled 2017-12-27: qty 100

## 2017-12-27 MED ORDER — 0.9 % SODIUM CHLORIDE (POUR BTL) OPTIME
TOPICAL | Status: DC | PRN
  Administered 2017-12-27: 1000 mL

## 2017-12-27 MED ORDER — MIDAZOLAM HCL 5 MG/5ML IJ SOLN
INTRAMUSCULAR | Status: DC | PRN
  Administered 2017-12-27: 2 mg via INTRAVENOUS

## 2017-12-27 MED ORDER — PHENOL 1.4 % MT LIQD: 1.0000 | OROMUCOSAL | Status: DC | PRN

## 2017-12-27 MED ORDER — PHENYLEPHRINE 40 MCG/ML (10ML) SYRINGE FOR IV PUSH (FOR BLOOD PRESSURE SUPPORT)
PREFILLED_SYRINGE | INTRAVENOUS | Status: AC
  Filled 2017-12-27: qty 10

## 2017-12-27 MED ORDER — CEFAZOLIN SODIUM-DEXTROSE 2-4 GM/100ML-% IV SOLN
INTRAVENOUS | Status: AC
  Filled 2017-12-27: qty 100

## 2017-12-27 MED ORDER — EPHEDRINE SULFATE 50 MG/ML IJ SOLN
INTRAMUSCULAR | Status: DC | PRN
  Administered 2017-12-27: 10 mg via INTRAVENOUS

## 2017-12-27 MED ORDER — FENTANYL CITRATE (PF) 100 MCG/2ML IJ SOLN: 25.0000 ug | INTRAMUSCULAR | Status: DC | PRN

## 2017-12-27 MED ORDER — DEXMEDETOMIDINE HCL IN NACL 200 MCG/50ML IV SOLN
INTRAVENOUS | Status: DC | PRN
  Administered 2017-12-27: 16 ug via INTRAVENOUS

## 2017-12-27 SURGICAL SUPPLY — 54 items
BAG SNAP BAND KOVER 36X36 (MISCELLANEOUS) ×2 IMPLANT
CANISTER SUCT 3000ML PPV (MISCELLANEOUS) ×2 IMPLANT
CATH ANGIO 5F BER2 65CM (CATHETERS) IMPLANT
CATH BEACON 5.038 65CM KMP-01 (CATHETERS) IMPLANT
CATH OMNI FLUSH .035X70CM (CATHETERS) IMPLANT
COVER PROBE W GEL 5X96 (DRAPES) ×2 IMPLANT
COVER WAND RF STERILE (DRAPES) ×2 IMPLANT
DERMABOND ADVANCED (GAUZE/BANDAGES/DRESSINGS) ×2
DERMABOND ADVANCED .7 DNX12 (GAUZE/BANDAGES/DRESSINGS) ×2 IMPLANT
DEVICE CLOSURE PERCLS PRGLD 6F (VASCULAR PRODUCTS) ×7 IMPLANT
DEVICE TORQUE KENDALL .025-038 (MISCELLANEOUS) ×2 IMPLANT
DRSG TEGADERM 2-3/8X2-3/4 SM (GAUZE/BANDAGES/DRESSINGS) ×4 IMPLANT
ELECT REM PT RETURN 9FT ADLT (ELECTROSURGICAL) ×4
ELECTRODE REM PT RTRN 9FT ADLT (ELECTROSURGICAL) ×2 IMPLANT
EXCLUDER TNK LEG 26MX12X14 (Endovascular Graft) ×1 IMPLANT
EXCLUDER TRUNK LEG 26MX12X14 (Endovascular Graft) ×2 IMPLANT
EXTENDER ENDOPROSTHESIS 12X7 (Endovascular Graft) ×2 IMPLANT
GAUZE SPONGE 2X2 8PLY STRL LF (GAUZE/BANDAGES/DRESSINGS) ×2 IMPLANT
GLOVE BIO SURGEON STRL SZ7.5 (GLOVE) ×2 IMPLANT
GOWN STRL REUS W/ TWL LRG LVL3 (GOWN DISPOSABLE) ×3 IMPLANT
GOWN STRL REUS W/TWL LRG LVL3 (GOWN DISPOSABLE) ×3
GRAFT BALLN CATH 65CM (STENTS) IMPLANT
GRAFT EXCLUDER AORTIC 26X3.3CM (Endovascular Graft) ×2 IMPLANT
GUIDEWIRE ANGLED .035X150CM (WIRE) ×2 IMPLANT
HEMOSTAT SPONGE AVITENE ULTRA (HEMOSTASIS) IMPLANT
KIT BASIN OR (CUSTOM PROCEDURE TRAY) ×2 IMPLANT
KIT TURNOVER KIT B (KITS) ×2 IMPLANT
LEG CONTRALATERAL 16X12X10 (Vascular Products) ×1 IMPLANT
LEG CONTRALATERAL 16X12X12 (Vascular Products) ×2 IMPLANT
NEEDLE PERC 18GX7CM (NEEDLE) ×2 IMPLANT
NS IRRIG 1000ML POUR BTL (IV SOLUTION) ×2 IMPLANT
PACK ENDOVASCULAR (PACKS) ×2 IMPLANT
PAD ARMBOARD 7.5X6 YLW CONV (MISCELLANEOUS) ×4 IMPLANT
PERCLOSE PROGLIDE 6F (VASCULAR PRODUCTS) ×14
SHEATH AVANTI 11CM 8FR (SHEATH) IMPLANT
SHEATH BRITE TIP 8FR 23CM (SHEATH) IMPLANT
SPONGE GAUZE 2X2 STER 10/PKG (GAUZE/BANDAGES/DRESSINGS) ×2
STAPLER VISISTAT 35W (STAPLE) IMPLANT
STENT GRAFT BALLN CATH 65CM (STENTS)
STENT GRAFT CONTRALAT 16X12X10 (Vascular Products) ×1 IMPLANT
STOPCOCK MORSE 400PSI 3WAY (MISCELLANEOUS) ×2 IMPLANT
SUT PROLENE 5 0 C 1 24 (SUTURE) IMPLANT
SUT VIC AB 2-0 SH 27 (SUTURE)
SUT VIC AB 2-0 SH 27XBRD (SUTURE) IMPLANT
SUT VIC AB 3-0 SH 27 (SUTURE)
SUT VIC AB 3-0 SH 27X BRD (SUTURE) IMPLANT
SUT VIC AB 4-0 PS2 18 (SUTURE) ×4 IMPLANT
SUT VICRYL 4-0 PS2 18IN ABS (SUTURE) ×4 IMPLANT
SYR 30ML LL (SYRINGE) ×2 IMPLANT
TOWEL GREEN STERILE (TOWEL DISPOSABLE) ×2 IMPLANT
TRAY FOLEY MTR SLVR 16FR STAT (SET/KITS/TRAYS/PACK) ×2 IMPLANT
TUBING HIGH PRESSURE 120CM (CONNECTOR) ×2 IMPLANT
WIRE AMPLATZ SS-J .035X180CM (WIRE) ×4 IMPLANT
WIRE BENTSON .035X145CM (WIRE) ×2 IMPLANT

## 2017-12-27 NOTE — Anesthesia Postprocedure Evaluation (Signed)
Anesthesia Post Note  Patient: Cody Benjamin  Procedure(s) Performed: ABDOMINAL AORTIC ENDOVASCULAR STENT GRAFT (N/A )     Patient location during evaluation: PACU Anesthesia Type: General Level of consciousness: awake and alert Pain management: pain level controlled Vital Signs Assessment: post-procedure vital signs reviewed and stable Respiratory status: spontaneous breathing, nonlabored ventilation, respiratory function stable and patient connected to nasal cannula oxygen Cardiovascular status: blood pressure returned to baseline and stable Postop Assessment: no apparent nausea or vomiting Anesthetic complications: no    Last Vitals:  Vitals:   12/27/17 1700 12/27/17 1913  BP: (!) 101/59 115/69  Pulse:    Resp: 17 18  Temp:  36.7 C  SpO2: 96% 92%    Last Pain:  Vitals:   12/27/17 1934  TempSrc:   PainSc: 1                  [name]

## 2017-12-27 NOTE — Anesthesia Procedure Notes (Signed)
Procedure Name: Intubation Date/Time: 12/27/2017 8:00 AM Performed by: Lance Coon, CRNA Pre-anesthesia Checklist: Patient identified, Emergency Drugs available, Suction available, Patient being monitored and Timeout performed Patient Re-evaluated:Patient Re-evaluated prior to induction Oxygen Delivery Method: Circle system utilized Preoxygenation: Pre-oxygenation with 100% oxygen Induction Type: IV induction Ventilation: Mask ventilation without difficulty and Oral airway inserted - appropriate to patient size Laryngoscope Size: Miller and 3 Grade View: Grade I Tube type: Oral Tube size: 7.5 mm Number of attempts: 1 Airway Equipment and Method: Stylet Placement Confirmation: ETT inserted through vocal cords under direct vision,  positive ETCO2 and breath sounds checked- equal and bilateral Secured at: 22 cm Tube secured with: Tape Dental Injury: Teeth and Oropharynx as per pre-operative assessment

## 2017-12-27 NOTE — Progress Notes (Signed)
  Day of Surgery Note    Subjective:  No complaints   Vitals:   12/27/17 1251 12/27/17 1306  BP: (!) 91/53 (!) 89/54  Pulse: 61 62  Resp: 15 (!) 23  Temp:    SpO2: 97% 98%    Incisions:   Bilateral groins are soft without hematoma Extremities:  +doppler signals bilateral DP/PT Cardiac:  regular Lungs:  Non labored Abdomen:  Slight tenderness   Assessment/Plan:  This is a 72 y.o. male who is s/p  Placement of a Gore Excluder stent graft for repair of infrarenal abdominal aortic aneurysm  -pt doing well in recovery but slightly hypotensive.  He has received 2 500cc fluid boluses.  Discussed with Dr. Oneida Alar and will give another 1L fluid bolus and check a hgb (not much blood loss intraop).   Leontine Locket, PA-C 12/27/2017 1:22 PM [phone]

## 2017-12-27 NOTE — Progress Notes (Signed)
Foley removed w tip intact and patient due to void by 2230.

## 2017-12-27 NOTE — Progress Notes (Signed)
Patient arrived from PACU in stable condition with bilateral groins clean, and dry with intact dressings and lelel 0. CHG bath given ank skin assessment complete. CCMD notified after telemetry applied and patient aware of bedrest and keeping legs straight as well as 30 degree HOB restriction.

## 2017-12-27 NOTE — Progress Notes (Signed)
Samantha PA at bedside in PACU, made aware of soft BP's despite 2 561ml boluses. Ordered additional 1 Liter of normal saline bolus. Will continue to monitor BP.

## 2017-12-27 NOTE — Op Note (Addendum)
Procedure: Placement of a Gore Excluder stent graft for repair of infrarenal abdominal aortic aneurysm  Preoperative diagnosis: Abdominal aortic aneurysm  Postoperative diagnosis: Same  Assistant: Gerri Lins, PA-C  Operative findings:   #1 Bilateral Proglide closure   #2 26x12x14 cm main body Gore Excluder device delivered via a right femoral system   #3 12 x 11.5 cm left iliac limb contralateral    #4 12 x 10 ipsilateral iliac extension right   #5 12 x 7  contralateral iliac extension left              #6 26 proximal cuff  PROCEDURE DETAIL: After obtaining informed consent the patient was taken to the operating room. The patient was placed in supine position the operating room table. After induction of general anesthesia and endotracheal intubation a Foley catheter was placed. Next the patient was prepped and draped in usual sterile fashion from the nipples down to the knees. Ultrasound was used to identify the right common femoral artery as well as the femoral bifurcation. An 11 blade was used to make a small neck in the skin over the level of the right common femoral artery. An introducer needle was then used to cannulate the right common femoral artery.  The guidewire would not pass initially.  Therefore the wire was removed and direct pressure held for about 5 minutes.  At this point I again placed the introducer needle into the right common femoral artery using ultrasound guidance.  A 0.035 Bentson wire was then threaded up into the abdominal aorta through the right femoral system.  The wire stopped at about the right iliac origin so I placed a 5 French sheath over the guidewire for extra-support and with a 5 [place] KMP catheter was able to direct the guidewire up into the abdominal aorta and into the descending thoracic aorta.  A short 9 French dilator was placed over the guidewire the right femoral system. This was used to dilate the tract. The dilator was then removed and a  Proglide device inserted over the guidewire into the right femoral system and this was deployed at the 2:00 position. The Proglide device was then removed and an additional Proglide device was brought in operative field and deployed at the 10:00 position. The sutures were kept separate and tagged with suture tags. Next the short 9 French sheath was then placed back over the guidewire into the right femoral system and the dilator removed and the sheath thoroughly flushed with heparinized saline. Attention was then turned to the left groin. Again using ultrasound the left common femoral artery was identified. The femoral bifurcation was also identified. A small nick was made in the skin with the 11 blade. A hemostat was used to dilate a tract down to the artery. An introducer needle was then used to cannulate the left common femoral artery without difficulty. A 0.035 Bentson wire was then threaded up into the abdominal aorta under fluoroscopic guidance. A 9 French dilator was then placed over the wire to dilate the tract. Two Proglide devices were again brought on operative field and these were fired sequentially in the 10:00 position followed by an additional Proglide at the 2:00 position. The Proglide delivery systems were removed and the long 9 French sheath placed back over the guidewire up to the level of the iliac of the aortic bifurcation.  At this point, a 5 [place] Omni flush catheter was placed over the guidewire and the left groin up and the abdominal aorta. An  abdominal aortogram was obtained in the AP position with 20 degrees of cranial caudal to determine level of the left and right renal arteries. At this point a 26 x 12 x 14 Gore Excluder main body device was selected. The Bentson wire from the right groin was advanced up into the descending thoracic aorta over a Kumpe catheter and the Bentsen wire replaced with an 035 Amplatz wire.  A 16 French dry seal sheath was then advanced over the Amplatz wire  in the right groin.  The main body device was then placed over the Amplatz wire in the right groin and advanced up to the level of the renal arteries. Magnified views of the renal arteries were performed to make sure that we were not covering these. The top portion of the stent graft was then deployed with the end of the stent just below the level of the left renal artery and this came over to just below the level of the right renal artery. The main body was delivered all the way down to the contralateral gate. Attention was then turned to the left groin. The Omni flush catheter was pulled down over a guidewire and removed and an 035 angled Glidewire wire placed in position to cannulate the contralateral gate. The KMP catheter was placed back over the guidewire in the left femoral system. A 5 [place] KMP catheter was placed over this and this was used to selectively catheterize the contralateral gate and the guidewire was then advanced into the descending thoracic aorta. The main body portion of the gate was confirmed by twirling the pigtail catheter. The pigtail  catheter was then placed in a location so that we could use its markers to determine the exact length to the iliac bifurcation. An Amplatz wire was placed back through the pigtail catheter.  A 12 French dry seal sheath was placed over this.  A retrograde contrast angiogram was performed to determine the level of the left internal iliac artery and a 12 x 11.5 cm iliac limb was selected. The pigtail catheter was removed over the guidewire and the 12 x 11.5 cm limb advanced so there was full coverage of the long marker on the contralateral limb.  There was still a gap above the iliac bifurcation so an additional 12 x 7 cm limb was placed in similar fashion and deployed this was then deployed in the usual fashion down to the iliac bifurcation. The delivery system was then removed. The remainder of the ipsilateral iliac limb was also deployed.  A retrograde  contrast angiogram was also performed to make sure that the right iliac limb did not cover the right internal iliac artery.  Measurement was made of the right iliac bifurcation and a 12 x10 cm ipsilateral limb was placed.  Attention was then turned back to the left iliac system and a Gore aortic balloon placed over the wire up to the level of the proximal end of the stent and this was ballooned to profile. The limb attachment site was also ballooned as well as the distal attachment site. Attention was then turned to the right groin and the balloon was advanced over the guidewire on the right side and the distal attachment site as well as the midportion of iliac limb were also ballooned. The 5 [place] Omni flush catheter was then placed back to the guidewire on the right side and a completion arteriogram was obtained. This showed no evidence of proximal or distal type I endoleak however the graft had  slipped down slightly below the left renal artery and there was a gap in this location. There was no type II endoleak. There is no filling of the aneurysm sac.  I elected to place a proximal cuff due to the short neck length.  A 26 mm cuff was advanced from the right side up to the level of the left renal artery and deployed.  This was not ballooned.  An additional completion angiogram performed showing no endoleak, with patent internal external iliac and renal arteries bilaterally with no endoleak.    At this point the Omni flush catheter was removed over a guidewire. All delivery devices were removed. We then proceeded to remove the large 18 French sheath from the right side with the guidewire in place. With pressure held above and below the insertion site the lateral Proglide closure was secured down.  An additional aP Proglide was then placed over the guidewire and deployed.  There was good hemostasis. The guidewire was removed from the right side. Attention was then turned to the left groin. In similar fashion the  12 French sheath was removed and the guidewire left in place.  The lateral and medial Proglide was secured and 1 additional Proglides were deployed over the wire to obtain hemostasis.  There was good hemostasis and the Bentsen wire was removed from the left groin.  There was still some slight oozing so direct pressure was held for about 20 minutes to achieve hemostasis.  The patient had been given 12000 units of heparin before introducing the main body device. This was partially reversed with 50 mg of protamine at the end of the case. Each groin puncture site was then closed with a running 4-0 Vicryl subcuticular stitch.  The patient tolerated procedure well and there were no complications. Instrument sponge and needle counts correct in the case. Patient was awakened in the operating room extubated and taken to the recovery room in stable condition.  Patient had good Doppler signals in both feet at conclusion of the case.  Ruta Hinds, MD [facility] Office: [phone] Pager: [phone]

## 2017-12-27 NOTE — Anesthesia Procedure Notes (Signed)
Arterial Line Insertion Start/End10/21/2019 8:00 AM, 12/27/2017 8:10 AM Performed by: Roberts Gaudy, MD, anesthesiologist  radial was placed Catheter size: 20 G  Attempts: 2 Procedure performed without using ultrasound guided technique. Following insertion, line sutured, dressing applied and Biopatch. Patient tolerated the procedure well with no immediate complications.

## 2017-12-27 NOTE — Anesthesia Preprocedure Evaluation (Signed)
Anesthesia Evaluation  Patient identified by MRN, date of birth, ID band Patient awake    Reviewed: Allergy & Precautions, NPO status , Patient's Chart, lab work & pertinent test results  Airway Mallampati: II  TM Distance: >3 FB     Dental  (+) Edentulous Upper, Edentulous Lower   Pulmonary former smoker,    breath sounds clear to auscultation       Cardiovascular  Rhythm:Regular Rate:Normal     Neuro/Psych    GI/Hepatic   Endo/Other    Renal/GU      Musculoskeletal   Abdominal   Peds  Hematology   Anesthesia Other Findings   Reproductive/Obstetrics                             Anesthesia Physical Anesthesia Plan  ASA: III  Anesthesia Plan: General   Post-op Pain Management:    Induction: Intravenous  PONV Risk Score and Plan: Ondansetron and Dexamethasone  Airway Management Planned: Oral ETT  Additional Equipment: Arterial line and CVP  Intra-op Plan:   Post-operative Plan: Extubation in OR  Informed Consent: I have reviewed the patients History and Physical, chart, labs and discussed the procedure including the risks, benefits and alternatives for the proposed anesthesia with the patient or authorized representative who has indicated his/her understanding and acceptance.     Plan Discussed with: CRNA and Anesthesiologist  Anesthesia Plan Comments:         Anesthesia Quick Evaluation

## 2017-12-27 NOTE — Interval H&P Note (Signed)
History and Physical Interval Note:  12/27/2017 7:24 AM  Cody Benjamin  has presented today for surgery, with the diagnosis of ABDOMINAL AORTIC ANEURYSM  The various methods of treatment have been discussed with the patient and family. After consideration of risks, benefits and other options for treatment, the patient has consented to  Procedure(s): ABDOMINAL AORTIC ENDOVASCULAR STENT GRAFT (N/A) as a surgical intervention .  The patient's history has been reviewed, patient examined, no change in status, stable for surgery.  I have reviewed the patient's chart and labs.  Questions were answered to the patient's satisfaction.     Ruta Hinds

## 2017-12-27 NOTE — Transfer of Care (Signed)
Immediate Anesthesia Transfer of Care Note  Patient: Cody Benjamin  Procedure(s) Performed: ABDOMINAL AORTIC ENDOVASCULAR STENT GRAFT (N/A )  Patient Location: PACU  Anesthesia Type:General  Level of Consciousness: awake and patient cooperative  Airway & Oxygen Therapy: Patient Spontanous Breathing  Post-op Assessment: Report given to RN and Post -op Vital signs reviewed and stable  Post vital signs: Reviewed and stable  Last Vitals:  Vitals Value Taken Time  BP 96/62 12/27/2017 11:36 AM  Temp    Pulse 92 12/27/2017 11:37 AM  Resp 13 12/27/2017 11:37 AM  SpO2 96 % 12/27/2017 11:37 AM  Vitals shown include unvalidated device data.  Last Pain:  Vitals:   12/27/17 0621  TempSrc:   PainSc: 3       Patients Stated Pain Goal: 3 (44/97/53 0051)  Complications: No apparent anesthesia complications

## 2017-12-28 ENCOUNTER — Other Ambulatory Visit: Payer: Self-pay

## 2017-12-28 ENCOUNTER — Encounter (HOSPITAL_COMMUNITY): Payer: Self-pay | Admitting: Vascular Surgery

## 2017-12-28 DIAGNOSIS — I714 Abdominal aortic aneurysm, without rupture, unspecified: Secondary | ICD-10-CM

## 2017-12-28 LAB — CBC
HCT: 33 % — ABNORMAL LOW (ref 39.0–52.0)
Hemoglobin: 10.9 g/dL — ABNORMAL LOW (ref 13.0–17.0)
MCH: 29.9 pg (ref 26.0–34.0)
MCHC: 33 g/dL (ref 30.0–36.0)
MCV: 90.7 fL (ref 80.0–100.0)
PLATELETS: 195 10*3/uL (ref 150–400)
RBC: 3.64 MIL/uL — ABNORMAL LOW (ref 4.22–5.81)
RDW: 12.3 % (ref 11.5–15.5)
WBC: 7.4 10*3/uL (ref 4.0–10.5)
nRBC: 0 % (ref 0.0–0.2)

## 2017-12-28 LAB — BASIC METABOLIC PANEL
Anion gap: 5 (ref 5–15)
BUN: 9 mg/dL (ref 8–23)
CALCIUM: 8.3 mg/dL — AB (ref 8.9–10.3)
CO2: 24 mmol/L (ref 22–32)
CREATININE: 1.16 mg/dL (ref 0.61–1.24)
Chloride: 108 mmol/L (ref 98–111)
Glucose, Bld: 106 mg/dL — ABNORMAL HIGH (ref 70–99)
Potassium: 3.6 mmol/L (ref 3.5–5.1)
SODIUM: 137 mmol/L (ref 135–145)

## 2017-12-28 MED ORDER — OXYCODONE-ACETAMINOPHEN 5-325 MG PO TABS: 1.0000 | ORAL_TABLET | Freq: Four times a day (QID) | ORAL | 0 refills | Status: DC | PRN

## 2017-12-28 NOTE — Discharge Instructions (Signed)
   [facility] [place]   Discharge Instructions  Endovascular Aortic Aneurysm Repair  Please refer to the following instructions for your post-procedure care. Your surgeon or Physician Assistant will discuss any changes with you.  Activity  You are encouraged to walk as much as you can. You can slowly return to normal activities but must avoid strenuous activity and heavy lifting until your doctor tells you it's OK. Avoid activities such as vacuuming or swinging a gold club. It is normal to feel tired for several weeks after your surgery. Do not drive until your doctor gives the OK and you are no longer taking prescription pain medications. It is also normal to have difficulty with sleep habits, eating, and bowel movements after surgery. These will go away with time.  Bathing/Showering  You may shower after you go home. If you have an incision, do not soak in a bathtub, hot tub, or swim until the incision heals completely.  Incision Care  Shower every day. Clean your incision with mild soap and water. Pat the area dry with a clean towel. You do not need a bandage unless otherwise instructed. Do not apply any ointments or creams to your incision. If you clothing is irritating, you may cover your incision with a dry gauze pad.  Diet  Resume your normal diet. There are no special food restrictions following this procedure. A low fat/low cholesterol diet is recommended for all patients with vascular disease. In order to heal from your surgery, it is CRITICAL to get adequate nutrition. Your body requires vitamins, minerals, and protein. Vegetables are the best source of vitamins and minerals. Vegetables also provide the perfect balance of protein. Processed food has little nutritional value, so try to avoid this.  Medications  Resume taking all of your medications unless your doctor or nurse practitioner tells you not to. If your incision is causing pain, you may take  over-the-counter pain relievers such as acetaminophen (Tylenol). If you were prescribed a stronger pain medication, please be aware these medications can cause nausea and constipation. Prevent nausea by taking the medication with a snack or meal. Avoid constipation by drinking plenty of fluids and eating foods with a high amount of fiber, such as fruits, vegetables, and grains. Do not take Tylenol if you are taking prescription pain medications.   Follow up  Our office will schedule a follow-up appointment with a C.T. scan 3-4 weeks after your surgery.  Please call us immediately for any of the following conditions  Severe or worsening pain in your legs or feet or in your abdomen back or chest. Increased pain, redness, drainage (pus) from your incision sit. Increased abdominal pain, bloating, nausea, vomiting or persistent diarrhea. Fever of 101 degrees or higher. Swelling in your leg (s),  Reduce your risk of vascular disease  Stop smoking. If you would like help call [facility] at 1-800-QUIT-NOW (1-800-784-8669) or [place] at [phone]. Manage your cholesterol Maintain a desired weight Control your diabetes Keep your blood pressure down  If you have questions, please call the office at [phone].   

## 2017-12-28 NOTE — Discharge Summary (Signed)
EVAR Discharge Summary   Cody Benjamin [birth year] 72 y.o. male  MRN: [id number]  Admission Date: 12/27/2017  Discharge Date: 12/28/17  Physician: Elam Dutch, MD  Admission Diagnosis: ABDOMINAL AORTIC ANEURYSM  Discharge Day services:    see progress note 12/28/17 Physical Exam: Vitals:   12/28/17 0350 12/28/17 0400  BP: 109/66   Pulse: (!) 113   Resp: 19 14  Temp: 98.2 F (36.8 C)   SpO2: 90% (!) 87%    Hospital Course:  The patient was admitted to the hospital and taken to the operating room on 12/27/2017 and underwent: Endovascular repair of abdominal aortic aneurysm    The pt tolerated the procedure well and was transported to the PACU in good condition.   Did have some hypotension postoperatively however this was corrected with fluid bolus.  The remainder of the hospital course consisted of increasing mobilization and increasing intake of solids without difficulty.  No change in renal function based on lab work  He will be discharged with 2-3 days of narcotic pain medication for continued post operative pain control.  He will follow up in office in 4 weeks with CTA EVAR protocol.  Discharge instructions were reviewed with the patient and he voices his understanding.  He will be discharged this morning to home in stable condition.  CBC    Component Value Date/Time   WBC 7.4 12/28/2017 0345   RBC 3.64 (L) 12/28/2017 0345   HGB 10.9 (L) 12/28/2017 0345   HCT 33.0 (L) 12/28/2017 0345   PLT 195 12/28/2017 0345   MCV 90.7 12/28/2017 0345   MCH 29.9 12/28/2017 0345   MCHC 33.0 12/28/2017 0345   RDW 12.3 12/28/2017 0345    BMET    Component Value Date/Time   NA 137 12/28/2017 0345   K 3.6 12/28/2017 0345   CL 108 12/28/2017 0345   CO2 24 12/28/2017 0345   GLUCOSE 106 (H) 12/28/2017 0345   BUN 9 12/28/2017 0345   CREATININE 1.16 12/28/2017 0345   CALCIUM 8.3 (L) 12/28/2017 0345   GFRNONAA >60 12/28/2017 0345   GFRAA >60 12/28/2017 0345          Discharge Diagnosis:  ABDOMINAL AORTIC ANEURYSM  Secondary Diagnosis: Patient Active Problem List   Diagnosis Date Noted  . AAA (abdominal aortic aneurysm) ([place]) 12/27/2017  . AAA (abdominal aortic aneurysm) without rupture ([place]) 12/15/2017  . Ex-cigarette smoker 12/15/2017  . Preoperative cardiovascular examination 12/15/2017   Past Medical History:  Diagnosis Date  . Arthritis    right shoulder  . BPH (benign prostatic hyperplasia)   . Chronic kidney disease    history of kidney stones  . Drug therapy   . Elevated blood sugar   . Fever   . History of kidney stones   . Lipids abnormal   . Pneumonia      Allergies as of 12/28/2017   No Known Allergies     Medication List    TAKE these medications   acetaminophen 500 MG tablet Commonly known as:  TYLENOL Take 1,000 mg by mouth every 6 (six) hours as needed for moderate pain or headache.   atorvastatin 20 MG tablet Commonly known as:  LIPITOR Take 1 tablet (20 mg total) by mouth daily.   diphenhydrAMINE 25 mg capsule Commonly known as:  BENADRYL Take 25 mg by mouth daily as needed for allergies.   oxyCODONE-acetaminophen 5-325 MG tablet Commonly known as:  PERCOCET/ROXICET Take 1 tablet by mouth every 6 (six) hours as needed  for up to 15 doses for severe pain.       Discharge Instructions:   [facility]  Discharge Instructions Endovascular Aortic Aneurysm Repair  Please refer to the following instructions for your post-procedure care. Your surgeon or Physician Assistant will discuss any changes with you.  Activity  You are encouraged to walk as much as you can. You can slowly return to normal activities but must avoid strenuous activity and heavy lifting until your doctor tells you it's OK. Avoid activities such as vacuuming or swinging a gold club. It is normal to feel tired for several weeks after your surgery. Do not drive until your doctor gives the OK and  you are no longer taking prescription pain medications. It is also normal to have difficulty with sleep habits, eating, and bowel movements after surgery. These will go away with time.  Bathing/Showering  You may shower after you go home. If you have an incision, do not soak in a bathtub, hot tub, or swim until the incision heals completely.  Incision Care  Shower every day. Clean your incision with mild soap and water. Pat the area dry with a clean towel. You do not need a bandage unless otherwise instructed. Do not apply any ointments or creams to your incision. If you clothing is irritating, you may cover your incision with a dry gauze pad.  Diet  Resume your normal diet. There are no special food restrictions following this procedure. A low fat/low cholesterol diet is recommended for all patients with vascular disease. In order to heal from your surgery, it is CRITICAL to get adequate nutrition. Your body requires vitamins, minerals, and protein. Vegetables are the best source of vitamins and minerals. Vegetables also provide the perfect balance of protein. Processed food has little nutritional value, so try to avoid this.  Medications  Resume taking all of your medications unless your doctor or Physician Assistnat tells you not to. If your incision is causing pain, you may take over-the-counter pain relievers such as acetaminophen (Tylenol). If you were prescribed a stronger pain medication, please be aware these medications can cause nausea and constipation. Prevent nausea by taking the medication with a snack or meal. Avoid constipation by drinking plenty of fluids and eating foods with a high amount of fiber, such as fruits, vegetables, and grains. Do not take Tylenol if you are taking prescription pain medications.   Follow up  [facility] office will schedule a follow-up appointment with a C.T. scan 3-4 weeks after your surgery.  Please call us immediately for any of the following  conditions  Severe or worsening pain in your legs or feet or in your abdomen back or chest. Increased pain, redness, drainage (pus) from your incision sit. Increased abdominal pain, bloating, nausea, vomiting or persistent diarrhea. Fever of 101 degrees or higher. Swelling in your leg (s),  Reduce your risk of vascular disease  .Stop smoking. If you would like help call [facility] at 1-800-QUIT-NOW (705) 801-3237) or [place] at [phone]. .Manage your cholesterol .Maintain a desired weight .Control your diabetes .Keep your blood pressure down  If you have questions, please call the office at [phone].   Disposition: home  Patient's condition: is Good  Follow up: 1. Dr. Oneida Alar in 4 weeks with CTA protocol   Dagoberto Ligas, PA-C [facility] [phone] 12/28/2017  10:06 AM   - For VQI Registry use - Post-op:  Time to Extubation: [x]  In OR, [ ]  < 12 hrs, [ ]   12-24 hrs, [ ]  >=24 hrs Vasopressors Req. Post-op: No MI: No., [ ]  Troponin only, [ ]  EKG or Clinical New Arrhythmia: No CHF: No ICU Stay: 0 days Transfusion: No    Complications: Resp failure: No., [ ]  Pneumonia, [ ]  Ventilator Chg in renal function: No., [ ]  Inc. Cr > 0.5, [ ]  Temp. Dialysis,  [ ]  Permanent dialysis Leg ischemia: No., no Surgery needed, [ ]  Yes, Surgery needed,  [ ]  Amputation Bowel ischemia: No., [ ]  Medical Rx, [ ]  Surgical Rx Wound complication: No., [ ]  Superficial separation/infection, [ ]  Return to OR Return to OR: No  Return to OR for bleeding: No Stroke: No., [ ]  Minor, [ ]  Major  Discharge medications: Statin use:  Yes  ASA use:  Yes  Plavix use:  No  Beta blocker use:  No  ARB use:  No ACEI use:  No CCB use:  No

## 2017-12-28 NOTE — Progress Notes (Addendum)
  Progress Note    12/28/2017 7:32 AM 1 Day Post-Op  Subjective:  Soreness R groin.   Vitals:   12/28/17 0350 12/28/17 0400  BP: 109/66   Pulse: (!) 113   Resp: 19 14  Temp: 98.2 F (36.8 C)   SpO2: 90% (!) 87%   Physical Exam: Cardiac:  RRR Lungs:  Non labored; reproducible L chest wall pain to palpation Incisions:  B groin cath sites are soft without firm hematoma; R groin tender to touch Extremities:  Palpable, symmetrical DP pulses Abdomen:  Soft Neurologic: A&O  CBC    Component Value Date/Time   WBC 7.4 12/28/2017 0345   RBC 3.64 (L) 12/28/2017 0345   HGB 10.9 (L) 12/28/2017 0345   HCT 33.0 (L) 12/28/2017 0345   PLT 195 12/28/2017 0345   MCV 90.7 12/28/2017 0345   MCH 29.9 12/28/2017 0345   MCHC 33.0 12/28/2017 0345   RDW 12.3 12/28/2017 0345    BMET    Component Value Date/Time   NA 137 12/28/2017 0345   K 3.6 12/28/2017 0345   CL 108 12/28/2017 0345   CO2 24 12/28/2017 0345   GLUCOSE 106 (H) 12/28/2017 0345   BUN 9 12/28/2017 0345   CREATININE 1.16 12/28/2017 0345   CALCIUM 8.3 (L) 12/28/2017 0345   GFRNONAA >60 12/28/2017 0345   GFRAA >60 12/28/2017 0345    INR    Component Value Date/Time   INR 1.35 12/27/2017 1303     Intake/Output Summary (Last 24 hours) at 12/28/2017 0732 Last data filed at 12/28/2017 0000 Gross per 24 hour  Intake 3920.56 ml  Output 1650 ml  Net 2270.56 ml     Assessment/Plan:  72 y.o. male is s/p EVAR 1 Day Post-Op  Perfusing BLE well with palpable DP pulses Groin sites are without hematoma Ok to discharge home if voiding, ambulating, and tolerating a regular diet CTA endograft protocol with office visit in 4 weeks  Dagoberto Ligas, PA-C [facility] [phone] 12/28/2017 7:32 AM  Agree with above D/c home  Ruta Hinds, MD [facility] [place]: [phone] Pager: [phone]

## 2017-12-28 NOTE — Progress Notes (Signed)
Discharge AVS meds take and those due reviewed with pt. Follow up appointments and when to call MD reviewed. All questions and concerns addressed. No further questions at this time. D/c IV and TELE, CCMD notified. D/C home per orders. Brought down with volunteer via wheelchair with family.

## 2017-12-29 ENCOUNTER — Telehealth: Payer: Self-pay | Admitting: Vascular Surgery

## 2017-12-29 NOTE — Telephone Encounter (Signed)
sch appt spk to pt wife mld ltr 01/21/18 10am CTA abd/pelvis 01/27/18 3pm p/o MD

## 2017-12-29 NOTE — Telephone Encounter (Signed)
-----   Message from Dagoberto Ligas, PA-C sent at 12/28/2017  7:40 AM EDT -----  Can you schedule an appt for this pt in 4 weeks with CTA abd/pelvis to see Dr. Oneida Alar.  PO EVAR. Thanks, [facility]

## 2018-01-05 DIAGNOSIS — J181 Lobar pneumonia, unspecified organism: Secondary | ICD-10-CM | POA: Diagnosis not present

## 2018-01-05 DIAGNOSIS — J189 Pneumonia, unspecified organism: Secondary | ICD-10-CM | POA: Diagnosis not present

## 2018-01-10 DIAGNOSIS — I739 Peripheral vascular disease, unspecified: Secondary | ICD-10-CM | POA: Diagnosis not present

## 2018-01-10 DIAGNOSIS — Z23 Encounter for immunization: Secondary | ICD-10-CM | POA: Diagnosis not present

## 2018-01-10 DIAGNOSIS — R7309 Other abnormal glucose: Secondary | ICD-10-CM | POA: Diagnosis not present

## 2018-01-10 DIAGNOSIS — Z6821 Body mass index (BMI) 21.0-21.9, adult: Secondary | ICD-10-CM | POA: Diagnosis not present

## 2018-01-10 DIAGNOSIS — E7889 Other lipoprotein metabolism disorders: Secondary | ICD-10-CM | POA: Diagnosis not present

## 2018-01-10 DIAGNOSIS — Z125 Encounter for screening for malignant neoplasm of prostate: Secondary | ICD-10-CM | POA: Diagnosis not present

## 2018-01-12 ENCOUNTER — Telehealth: Payer: Self-pay | Admitting: *Deleted

## 2018-01-12 NOTE — Telephone Encounter (Signed)
Call to ANGEL at Dr. Viviann Spare office. Dr. Oneida Alar approved low dose ASA.

## 2018-01-13 ENCOUNTER — Encounter: Payer: Self-pay | Admitting: Family Medicine

## 2018-01-21 ENCOUNTER — Ambulatory Visit
Admission: RE | Admit: 2018-01-21 | Discharge: 2018-01-21 | Disposition: A | Discharge status: Home / Self Care | Payer: PPO | Source: Ambulatory Visit | Attending: Vascular Surgery | Admitting: Vascular Surgery

## 2018-01-21 DIAGNOSIS — I714 Abdominal aortic aneurysm, without rupture, unspecified: Secondary | ICD-10-CM

## 2018-01-21 MED ORDER — IOPAMIDOL (ISOVUE-370) INJECTION 76%
75.0000 mL | Freq: Once | INTRAVENOUS | Status: AC | PRN
  Administered 2018-01-21: 75 mL via INTRAVENOUS

## 2018-01-27 ENCOUNTER — Ambulatory Visit (INDEPENDENT_AMBULATORY_CARE_PROVIDER_SITE_OTHER): Payer: Self-pay | Admitting: Vascular Surgery

## 2018-01-27 ENCOUNTER — Other Ambulatory Visit: Payer: Self-pay

## 2018-01-27 ENCOUNTER — Encounter: Payer: Self-pay | Admitting: Vascular Surgery

## 2018-01-27 VITALS — BP 136/76 | HR 96 | Temp 97.2°F | Resp 16 | Ht 68.0 in | Wt 146.0 lb

## 2018-01-27 DIAGNOSIS — I714 Abdominal aortic aneurysm, without rupture, unspecified: Secondary | ICD-10-CM

## 2018-01-27 NOTE — Progress Notes (Signed)
Patient is a 72 year old male who returns for postoperative follow-up today after recent endovascular stent graft repair with a Gore Excluder graft for infrarenal 6.4 cm abdominal aortic aneurysm.  He still has some soreness in his lower abdomen and groins.  Overall this is improved.  He denies any claudication symptoms.  Physical exam:  Vitals:   01/27/18 1432  BP: 136/76  Pulse: 96  Resp: 16  Temp: (!) 97.2 F (36.2 C)  TempSrc: Oral  SpO2: 99%  Weight: 146 lb (66.2 kg)  Height: 5\' 8"  (1.727 m)    Extremities: 2+ right dorsalis pedis pulse absent posterior tibial pulses bilaterally absent left dorsalis pedis pulse feet pink warm well-perfused, 2+ femoral pulses no pulsatile mass  Abdomen: Soft nontender nondistended no pulsatile mass  Data: Patient had a CT angiogram of his abdomen and pelvis last week.  This shows a 6.4 cm abdominal aortic aneurysm.  There was a 3 mm left femoral pseudoaneurysm partially thrombosed.  There is no evidence of type I or type II endoleak.  Assessment: Doing well status post a Gore Excluder stent graft repair of infrarenal abdominal aortic aneurysm no evidence of endoleak.  3 mm left common femoral pseudoaneurysm no palpable mass on exam most likely this will spontaneously thrombosed over time  Plan: Patient will follow-up in July 2020 with a repeat ultrasound of his abdominal aorta at that time.  If that ultrasound shows no evidence of endoleak he can probably go to once yearly.  Ruta Hinds, MD [facility] [place] Office: [phone] Pager: [phone]

## 2018-02-10 ENCOUNTER — Other Ambulatory Visit: Payer: PPO

## 2018-03-17 ENCOUNTER — Encounter: Payer: Self-pay | Admitting: Cardiology

## 2018-03-17 ENCOUNTER — Ambulatory Visit (INDEPENDENT_AMBULATORY_CARE_PROVIDER_SITE_OTHER): Payer: PPO | Admitting: Cardiology

## 2018-03-17 VITALS — BP 118/80 | HR 92 | Ht 68.0 in | Wt 154.0 lb

## 2018-03-17 DIAGNOSIS — I714 Abdominal aortic aneurysm, without rupture, unspecified: Secondary | ICD-10-CM

## 2018-03-17 DIAGNOSIS — Z87891 Personal history of nicotine dependence: Secondary | ICD-10-CM

## 2018-03-17 NOTE — Addendum Note (Signed)
Addended by: Tarri Glenn on: 03/17/2018 11:40 AM   Modules accepted: Orders

## 2018-03-17 NOTE — Progress Notes (Signed)
Cardiology Office Note:    Date:  03/17/2018   ID:  Cody Benjamin, DOB 1945/05/29, MRN [id number]  PCP:  Ocie Doyne., MD  Cardiologist:  Jenean Lindau, MD   Referring MD: Ocie Doyne., MD    ASSESSMENT:    1. Abdominal aortic aneurysm (AAA) without rupture ([name])   2. Ex-cigarette smoker    PLAN:    In order of problems listed above:  1. Secondary prevention stressed with the patient.  Importance of compliance with diet and medication stressed and he vocalized understanding.  His blood pressure is stable.  Diet was discussed for dyslipidemia and we will recheck his lipids today.  I would like to be aggressive with his lipid management.  His blood pressure is stable.Patient will be seen in follow-up appointment in 6 months or earlier if the patient has any concerns. 2. He promises never to go back to smoking again.   Medication Adjustments/Labs and Tests Ordered: Current medicines are reviewed at length with the patient today.  Concerns regarding medicines are outlined above.  No orders of the defined types were placed in this encounter.  No orders of the defined types were placed in this encounter.    No chief complaint on file.    History of Present Illness:    Cody Benjamin is a 73 y.o. male.  Patient is post repair of abdominal aortic aneurysm and he denies any problems at this time and takes care of activities of daily living.  No chest pain orthopnea or PND.  At the time of my evaluation, the patient is alert awake oriented and in no distress.  Past Medical History:  Diagnosis Date  . Arthritis    right shoulder  . BPH (benign prostatic hyperplasia)   . Chronic kidney disease    history of kidney stones  . Drug therapy   . Elevated blood sugar   . Fever   . History of kidney stones   . Lipids abnormal   . Pneumonia     Past Surgical History:  Procedure Laterality Date  . ABDOMINAL AORTIC ENDOVASCULAR STENT GRAFT N/A 12/27/2017   Procedure: ABDOMINAL  AORTIC ENDOVASCULAR STENT GRAFT;  Surgeon: Elam Dutch, MD;  Location[facility] OR;  Service: Vascular;  Laterality: N/A;  . COLONOSCOPY    . SHOULDER SURGERY Right 1969    Current Medications: Current Meds  Medication Sig  . acetaminophen (TYLENOL) 500 MG tablet Take 1,000 mg by mouth every 6 (six) hours as needed for moderate pain or headache.  Marland Kitchen aspirin EC 81 MG tablet Take 81 mg by mouth daily.     Allergies:   Patient has no known allergies.   Social History   Socioeconomic History  . Marital status: Married    Spouse name: Not on file  . Number of children: Not on file  . Years of education: Not on file  . Highest education level: Not on file  Occupational History  . Not on file  Social Needs  . Financial resource strain: Not on file  . Food insecurity:    Worry: Not on file    Inability: Not on file  . Transportation needs:    Medical: Not on file    Non-medical: Not on file  Tobacco Use  . Smoking status: Former Smoker    Last attempt to quit: 03/09/2013    Years since quitting: 5.0  . Smokeless tobacco: Never Used  Substance and Sexual Activity  . Alcohol use: Never  Frequency: Never  . Drug use: Never  . Sexual activity: Not on file  Lifestyle  . Physical activity:    Days per week: Not on file    Minutes per session: Not on file  . Stress: Not on file  Relationships  . Social connections:    Talks on phone: Not on file    Gets together: Not on file    Attends religious service: Not on file    Active member of club or organization: Not on file    Attends meetings of clubs or organizations: Not on file    Relationship status: Not on file  Other Topics Concern  . Not on file  Social History Narrative  . Not on file     Family History: The patient's family history includes Keylon cancer in his mother; Seizures in his brother.  ROS:   Please see the history of present illness.    All other systems reviewed and are negative.  EKGs/Labs/Other  Studies Reviewed:    The following studies were reviewed today: I discussed my findings with the patient at extensive length.   Recent Labs: 12/21/2017: ALT 14 12/24/2017: TSH 3.480 12/27/2017: Magnesium 1.6 12/28/2017: BUN 9; Creatinine, Ser 1.16; Hemoglobin 10.9; Platelets 195; Potassium 3.6; Sodium 137  Recent Lipid Panel    Component Value Date/Time   CHOL 172 12/24/2017 0849   TRIG 122 12/24/2017 0849   HDL 37 (L) 12/24/2017 0849   CHOLHDL 4.6 12/24/2017 0849   LDLCALC 111 (H) 12/24/2017 0849    Physical Exam:    VS:  BP 118/80 (BP Location: Right Arm, Patient Position: Sitting, Cuff Size: Normal)   Pulse 92   Ht 5\' 8"  (1.727 m)   Wt 154 lb (69.9 kg)   SpO2 98%   BMI 23.42 kg/m     Wt Readings from Last 3 Encounters:  03/17/18 154 lb (69.9 kg)  01/27/18 146 lb (66.2 kg)  12/28/17 149 lb 14.6 oz (68 kg)     GEN: Patient is in no acute distress HEENT: Normal NECK: No JVD; No carotid bruits LYMPHATICS: No lymphadenopathy CARDIAC: Hear sounds regular, 2/6 systolic murmur at the apex. RESPIRATORY:  Clear to auscultation without rales, wheezing or rhonchi  ABDOMEN: Soft, non-tender, non-distended MUSCULOSKELETAL:  No edema; No deformity  SKIN: Warm and dry NEUROLOGIC:  Alert and oriented x 3 PSYCHIATRIC:  Normal affect   Signed, Jenean Lindau, MD  03/17/2018 11:35 AM    [place]

## 2018-03-17 NOTE — Patient Instructions (Signed)
Medication Instructions:  Your physician recommends that you continue on your current medications as directed. Please refer to the Current Medication list given to you today.  If you need a refill on your cardiac medications before your next appointment, please call your pharmacy.   Lab work: Your physician recommends that you return for lab work in: BMP,TSH,LFT and Lipid  If you have labs (blood work) drawn today and your tests are completely normal, you will receive your results only by: Marland Kitchen MyChart Message (if you have MyChart) OR . A paper copy in the mail If you have any lab test that is abnormal or we need to change your treatment, we will call you to review the results.  Testing/Procedures: None  Follow-Up: At [facility], you and your health needs are our priority.  As part of our continuing mission to provide you with exceptional heart care, we have created designated Provider Care Teams.  These Care Teams include your primary Cardiologist (physician) and Advanced Practice Providers (APPs -  Physician Assistants and Nurse Practitioners) who all work together to provide you with the care you need, when you need it. You will need a follow up appointment in 6 months.  Please call our office 2 months in advance to schedule this appointment.  You may see No primary care provider on file. or another member of our [employer] Provider Team in [place]: Jenne Campus, MD . Shirlee More, MD  Any Other Special Instructions Will Be Listed Below (If Applicable).

## 2018-03-18 ENCOUNTER — Telehealth: Payer: Self-pay

## 2018-03-18 LAB — HEPATIC FUNCTION PANEL
ALBUMIN: 4.5 g/dL (ref 3.5–4.8)
ALT: 48 IU/L — ABNORMAL HIGH (ref 0–44)
AST: 25 IU/L (ref 0–40)
Alkaline Phosphatase: 114 IU/L (ref 39–117)
BILIRUBIN TOTAL: 0.3 mg/dL (ref 0.0–1.2)
Bilirubin, Direct: 0.11 mg/dL (ref 0.00–0.40)
TOTAL PROTEIN: 7.1 g/dL (ref 6.0–8.5)

## 2018-03-18 LAB — BASIC METABOLIC PANEL
BUN/Creatinine Ratio: 11 (ref 10–24)
BUN: 16 mg/dL (ref 8–27)
CALCIUM: 10 mg/dL (ref 8.6–10.2)
CO2: 26 mmol/L (ref 20–29)
CREATININE: 1.41 mg/dL — AB (ref 0.76–1.27)
Chloride: 103 mmol/L (ref 96–106)
GFR calc Af Amer: 58 mL/min/{1.73_m2} — ABNORMAL LOW (ref >59)
GFR calc non Af Amer: 50 mL/min/{1.73_m2} — ABNORMAL LOW (ref >59)
Glucose: 75 mg/dL (ref 65–99)
POTASSIUM: 4.2 mmol/L (ref 3.5–5.2)
Sodium: 143 mmol/L (ref 134–144)

## 2018-03-18 LAB — LIPID PANEL
CHOL/HDL RATIO: 4.5 ratio (ref 0.0–5.0)
Cholesterol, Total: 186 mg/dL (ref 100–199)
HDL: 41 mg/dL (ref >39)
LDL CALC: 120 mg/dL — AB (ref 0–99)
TRIGLYCERIDES: 125 mg/dL (ref 0–149)
VLDL Cholesterol Cal: 25 mg/dL (ref 5–40)

## 2018-03-18 LAB — TSH: TSH: 2.88 u[IU]/mL (ref 0.450–4.500)

## 2018-03-18 NOTE — Telephone Encounter (Signed)
Patient called and notified of lab results. 

## 2018-03-18 NOTE — Telephone Encounter (Signed)
-----   Message from Jenean Lindau, MD sent at 03/18/2018  8:19 AM EST ----- The results of the study is unremarkable. Please inform patient. Mild lft elevation  I will discuss in detail at next appointment. Cc  primary care/referring physician Jenean Lindau, MD 03/18/2018 8:19 AM

## 2018-04-14 ENCOUNTER — Other Ambulatory Visit (HOSPITAL_COMMUNITY): Payer: PPO

## 2018-04-14 ENCOUNTER — Ambulatory Visit: Payer: PPO | Admitting: Vascular Surgery

## 2018-04-18 DIAGNOSIS — R221 Localized swelling, mass and lump, neck: Secondary | ICD-10-CM | POA: Diagnosis not present

## 2018-04-18 DIAGNOSIS — Z6822 Body mass index (BMI) 22.0-22.9, adult: Secondary | ICD-10-CM | POA: Diagnosis not present

## 2018-04-18 DIAGNOSIS — I739 Peripheral vascular disease, unspecified: Secondary | ICD-10-CM | POA: Diagnosis not present

## 2018-04-18 DIAGNOSIS — E7889 Other lipoprotein metabolism disorders: Secondary | ICD-10-CM | POA: Diagnosis not present

## 2018-04-18 DIAGNOSIS — R7309 Other abnormal glucose: Secondary | ICD-10-CM | POA: Diagnosis not present

## 2018-04-26 DIAGNOSIS — Z9889 Other specified postprocedural states: Secondary | ICD-10-CM | POA: Diagnosis not present

## 2018-04-26 DIAGNOSIS — Z87891 Personal history of nicotine dependence: Secondary | ICD-10-CM | POA: Diagnosis not present

## 2018-04-26 DIAGNOSIS — R221 Localized swelling, mass and lump, neck: Secondary | ICD-10-CM | POA: Diagnosis not present

## 2018-04-26 DIAGNOSIS — I739 Peripheral vascular disease, unspecified: Secondary | ICD-10-CM | POA: Diagnosis not present

## 2018-04-26 DIAGNOSIS — E7889 Other lipoprotein metabolism disorders: Secondary | ICD-10-CM | POA: Diagnosis not present

## 2018-04-26 DIAGNOSIS — Z6822 Body mass index (BMI) 22.0-22.9, adult: Secondary | ICD-10-CM | POA: Diagnosis not present

## 2018-04-26 DIAGNOSIS — R7309 Other abnormal glucose: Secondary | ICD-10-CM | POA: Diagnosis not present

## 2018-04-28 DIAGNOSIS — R221 Localized swelling, mass and lump, neck: Secondary | ICD-10-CM | POA: Diagnosis not present

## 2018-04-29 DIAGNOSIS — J301 Allergic rhinitis due to pollen: Secondary | ICD-10-CM | POA: Diagnosis not present

## 2018-04-29 DIAGNOSIS — Z6822 Body mass index (BMI) 22.0-22.9, adult: Secondary | ICD-10-CM | POA: Diagnosis not present

## 2018-04-29 DIAGNOSIS — J020 Streptococcal pharyngitis: Secondary | ICD-10-CM | POA: Diagnosis not present

## 2018-04-29 DIAGNOSIS — J029 Acute pharyngitis, unspecified: Secondary | ICD-10-CM | POA: Diagnosis not present

## 2018-05-06 DIAGNOSIS — C7989 Secondary malignant neoplasm of other specified sites: Secondary | ICD-10-CM | POA: Diagnosis not present

## 2018-05-06 DIAGNOSIS — R590 Localized enlarged lymph nodes: Secondary | ICD-10-CM | POA: Diagnosis not present

## 2018-05-06 DIAGNOSIS — C4492 Squamous cell carcinoma of skin, unspecified: Secondary | ICD-10-CM | POA: Diagnosis not present

## 2018-05-06 DIAGNOSIS — R221 Localized swelling, mass and lump, neck: Secondary | ICD-10-CM | POA: Diagnosis not present

## 2018-05-16 DIAGNOSIS — C099 Malignant neoplasm of tonsil, unspecified: Secondary | ICD-10-CM | POA: Diagnosis not present

## 2018-05-16 DIAGNOSIS — F17200 Nicotine dependence, unspecified, uncomplicated: Secondary | ICD-10-CM | POA: Diagnosis not present

## 2018-05-16 DIAGNOSIS — C779 Secondary and unspecified malignant neoplasm of lymph node, unspecified: Secondary | ICD-10-CM | POA: Diagnosis not present

## 2018-05-16 DIAGNOSIS — R221 Localized swelling, mass and lump, neck: Secondary | ICD-10-CM | POA: Diagnosis not present

## 2018-05-16 DIAGNOSIS — J342 Deviated nasal septum: Secondary | ICD-10-CM | POA: Diagnosis not present

## 2018-05-18 DIAGNOSIS — C090 Malignant neoplasm of tonsillar fossa: Secondary | ICD-10-CM | POA: Diagnosis not present

## 2018-05-19 DIAGNOSIS — C090 Malignant neoplasm of tonsillar fossa: Secondary | ICD-10-CM | POA: Diagnosis not present

## 2018-05-19 DIAGNOSIS — C770 Secondary and unspecified malignant neoplasm of lymph nodes of head, face and neck: Secondary | ICD-10-CM | POA: Diagnosis not present

## 2018-05-25 DIAGNOSIS — C090 Malignant neoplasm of tonsillar fossa: Secondary | ICD-10-CM | POA: Diagnosis not present

## 2018-05-25 DIAGNOSIS — C099 Malignant neoplasm of tonsil, unspecified: Secondary | ICD-10-CM | POA: Diagnosis not present

## 2018-05-26 DIAGNOSIS — C090 Malignant neoplasm of tonsillar fossa: Secondary | ICD-10-CM | POA: Diagnosis not present

## 2018-05-30 DIAGNOSIS — C770 Secondary and unspecified malignant neoplasm of lymph nodes of head, face and neck: Secondary | ICD-10-CM | POA: Insufficient documentation

## 2018-05-30 DIAGNOSIS — C099 Malignant neoplasm of tonsil, unspecified: Secondary | ICD-10-CM | POA: Insufficient documentation

## 2018-05-30 HISTORY — DX: Secondary and unspecified malignant neoplasm of lymph nodes of head, face and neck: C77.0

## 2018-05-30 HISTORY — DX: Malignant neoplasm of tonsil, unspecified: C09.9

## 2018-05-31 DIAGNOSIS — C090 Malignant neoplasm of tonsillar fossa: Secondary | ICD-10-CM | POA: Diagnosis not present

## 2018-06-01 DIAGNOSIS — C090 Malignant neoplasm of tonsillar fossa: Secondary | ICD-10-CM | POA: Diagnosis not present

## 2018-06-01 DIAGNOSIS — Z510 Encounter for antineoplastic radiation therapy: Secondary | ICD-10-CM | POA: Diagnosis not present

## 2018-06-02 DIAGNOSIS — Z510 Encounter for antineoplastic radiation therapy: Secondary | ICD-10-CM | POA: Diagnosis not present

## 2018-06-02 DIAGNOSIS — C090 Malignant neoplasm of tonsillar fossa: Secondary | ICD-10-CM | POA: Diagnosis not present

## 2018-06-03 DIAGNOSIS — Z7952 Long term (current) use of systemic steroids: Secondary | ICD-10-CM | POA: Diagnosis not present

## 2018-06-03 DIAGNOSIS — C770 Secondary and unspecified malignant neoplasm of lymph nodes of head, face and neck: Secondary | ICD-10-CM | POA: Diagnosis not present

## 2018-06-03 DIAGNOSIS — C099 Malignant neoplasm of tonsil, unspecified: Secondary | ICD-10-CM | POA: Diagnosis not present

## 2018-06-03 DIAGNOSIS — Z79899 Other long term (current) drug therapy: Secondary | ICD-10-CM | POA: Diagnosis not present

## 2018-06-06 DIAGNOSIS — Z510 Encounter for antineoplastic radiation therapy: Secondary | ICD-10-CM | POA: Diagnosis not present

## 2018-06-06 DIAGNOSIS — C090 Malignant neoplasm of tonsillar fossa: Secondary | ICD-10-CM | POA: Diagnosis not present

## 2018-06-06 DIAGNOSIS — Z5111 Encounter for antineoplastic chemotherapy: Secondary | ICD-10-CM | POA: Diagnosis not present

## 2018-06-07 DIAGNOSIS — C090 Malignant neoplasm of tonsillar fossa: Secondary | ICD-10-CM | POA: Diagnosis not present

## 2018-06-08 DIAGNOSIS — Z510 Encounter for antineoplastic radiation therapy: Secondary | ICD-10-CM | POA: Diagnosis not present

## 2018-06-08 DIAGNOSIS — C090 Malignant neoplasm of tonsillar fossa: Secondary | ICD-10-CM | POA: Diagnosis not present

## 2018-06-08 DIAGNOSIS — M8000XS Age-related osteoporosis with current pathological fracture, unspecified site, sequela: Secondary | ICD-10-CM | POA: Diagnosis not present

## 2018-06-08 DIAGNOSIS — E7800 Pure hypercholesterolemia, unspecified: Secondary | ICD-10-CM | POA: Diagnosis not present

## 2018-06-08 DIAGNOSIS — Z79899 Other long term (current) drug therapy: Secondary | ICD-10-CM | POA: Diagnosis not present

## 2018-06-08 DIAGNOSIS — B001 Herpesviral vesicular dermatitis: Secondary | ICD-10-CM | POA: Diagnosis not present

## 2018-06-08 DIAGNOSIS — M818 Other osteoporosis without current pathological fracture: Secondary | ICD-10-CM | POA: Diagnosis not present

## 2018-06-09 DIAGNOSIS — Z23 Encounter for immunization: Secondary | ICD-10-CM | POA: Diagnosis not present

## 2018-06-09 DIAGNOSIS — K5900 Constipation, unspecified: Secondary | ICD-10-CM | POA: Diagnosis not present

## 2018-06-09 DIAGNOSIS — Z0001 Encounter for general adult medical examination with abnormal findings: Secondary | ICD-10-CM | POA: Diagnosis not present

## 2018-06-09 DIAGNOSIS — Z0000 Encounter for general adult medical examination without abnormal findings: Secondary | ICD-10-CM | POA: Diagnosis not present

## 2018-06-09 DIAGNOSIS — R112 Nausea with vomiting, unspecified: Secondary | ICD-10-CM | POA: Diagnosis not present

## 2018-06-09 DIAGNOSIS — J189 Pneumonia, unspecified organism: Secondary | ICD-10-CM | POA: Diagnosis not present

## 2018-06-09 DIAGNOSIS — R109 Unspecified abdominal pain: Secondary | ICD-10-CM | POA: Diagnosis not present

## 2018-06-09 DIAGNOSIS — R100 Acute abdomen: Secondary | ICD-10-CM | POA: Diagnosis not present

## 2018-06-09 DIAGNOSIS — C090 Malignant neoplasm of tonsillar fossa: Secondary | ICD-10-CM | POA: Diagnosis not present

## 2018-06-10 DIAGNOSIS — E872 Acidosis: Secondary | ICD-10-CM | POA: Diagnosis not present

## 2018-06-10 DIAGNOSIS — Z87891 Personal history of nicotine dependence: Secondary | ICD-10-CM | POA: Diagnosis not present

## 2018-06-10 DIAGNOSIS — I2510 Atherosclerotic heart disease of native coronary artery without angina pectoris: Secondary | ICD-10-CM | POA: Diagnosis not present

## 2018-06-10 DIAGNOSIS — Z833 Family history of diabetes mellitus: Secondary | ICD-10-CM | POA: Diagnosis not present

## 2018-06-10 DIAGNOSIS — N179 Acute kidney failure, unspecified: Secondary | ICD-10-CM | POA: Diagnosis not present

## 2018-06-10 DIAGNOSIS — M069 Rheumatoid arthritis, unspecified: Secondary | ICD-10-CM | POA: Diagnosis not present

## 2018-06-10 DIAGNOSIS — I130 Hypertensive heart and chronic kidney disease with heart failure and stage 1 through stage 4 chronic kidney disease, or unspecified chronic kidney disease: Secondary | ICD-10-CM | POA: Diagnosis not present

## 2018-06-10 DIAGNOSIS — Z8249 Family history of ischemic heart disease and other diseases of the circulatory system: Secondary | ICD-10-CM | POA: Diagnosis not present

## 2018-06-10 DIAGNOSIS — D649 Anemia, unspecified: Secondary | ICD-10-CM | POA: Diagnosis not present

## 2018-06-10 DIAGNOSIS — E785 Hyperlipidemia, unspecified: Secondary | ICD-10-CM | POA: Diagnosis not present

## 2018-06-10 DIAGNOSIS — Z9183 Wandering in diseases classified elsewhere: Secondary | ICD-10-CM | POA: Diagnosis not present

## 2018-06-10 DIAGNOSIS — J449 Chronic obstructive pulmonary disease, unspecified: Secondary | ICD-10-CM | POA: Diagnosis not present

## 2018-06-10 DIAGNOSIS — N1832 Chronic kidney disease, stage 3b: Secondary | ICD-10-CM | POA: Diagnosis not present

## 2018-06-10 DIAGNOSIS — C090 Malignant neoplasm of tonsillar fossa: Secondary | ICD-10-CM | POA: Diagnosis not present

## 2018-06-10 DIAGNOSIS — I2721 Secondary pulmonary arterial hypertension: Secondary | ICD-10-CM | POA: Diagnosis not present

## 2018-06-10 DIAGNOSIS — Z66 Do not resuscitate: Secondary | ICD-10-CM | POA: Diagnosis not present

## 2018-06-10 DIAGNOSIS — I5043 Acute on chronic combined systolic (congestive) and diastolic (congestive) heart failure: Secondary | ICD-10-CM | POA: Diagnosis not present

## 2018-06-10 DIAGNOSIS — Z8673 Personal history of transient ischemic attack (TIA), and cerebral infarction without residual deficits: Secondary | ICD-10-CM | POA: Diagnosis not present

## 2018-06-10 DIAGNOSIS — Z823 Family history of stroke: Secondary | ICD-10-CM | POA: Diagnosis not present

## 2018-06-10 DIAGNOSIS — F329 Major depressive disorder, single episode, unspecified: Secondary | ICD-10-CM | POA: Diagnosis not present

## 2018-06-10 DIAGNOSIS — Z510 Encounter for antineoplastic radiation therapy: Secondary | ICD-10-CM | POA: Diagnosis not present

## 2018-06-10 DIAGNOSIS — F0390 Unspecified dementia without behavioral disturbance: Secondary | ICD-10-CM | POA: Diagnosis not present

## 2018-06-10 DIAGNOSIS — I252 Old myocardial infarction: Secondary | ICD-10-CM | POA: Diagnosis not present

## 2018-06-13 DIAGNOSIS — Z09 Encounter for follow-up examination after completed treatment for conditions other than malignant neoplasm: Secondary | ICD-10-CM | POA: Insufficient documentation

## 2018-06-13 DIAGNOSIS — Z510 Encounter for antineoplastic radiation therapy: Secondary | ICD-10-CM | POA: Diagnosis not present

## 2018-06-13 DIAGNOSIS — I25118 Atherosclerotic heart disease of native coronary artery with other forms of angina pectoris: Secondary | ICD-10-CM | POA: Diagnosis not present

## 2018-06-13 DIAGNOSIS — C090 Malignant neoplasm of tonsillar fossa: Secondary | ICD-10-CM | POA: Diagnosis not present

## 2018-06-13 HISTORY — DX: Encounter for follow-up examination after completed treatment for conditions other than malignant neoplasm: Z09

## 2018-06-14 DIAGNOSIS — Z510 Encounter for antineoplastic radiation therapy: Secondary | ICD-10-CM | POA: Diagnosis not present

## 2018-06-14 DIAGNOSIS — N451 Epididymitis: Secondary | ICD-10-CM | POA: Diagnosis not present

## 2018-06-14 DIAGNOSIS — R1312 Dysphagia, oropharyngeal phase: Secondary | ICD-10-CM | POA: Diagnosis not present

## 2018-06-14 DIAGNOSIS — N318 Other neuromuscular dysfunction of bladder: Secondary | ICD-10-CM | POA: Diagnosis not present

## 2018-06-14 DIAGNOSIS — N3020 Other chronic cystitis without hematuria: Secondary | ICD-10-CM | POA: Diagnosis not present

## 2018-06-14 DIAGNOSIS — C090 Malignant neoplasm of tonsillar fossa: Secondary | ICD-10-CM | POA: Diagnosis not present

## 2018-06-15 DIAGNOSIS — N3001 Acute cystitis with hematuria: Secondary | ICD-10-CM | POA: Diagnosis not present

## 2018-06-15 DIAGNOSIS — I8311 Varicose veins of right lower extremity with inflammation: Secondary | ICD-10-CM | POA: Diagnosis not present

## 2018-06-15 DIAGNOSIS — Z6829 Body mass index (BMI) 29.0-29.9, adult: Secondary | ICD-10-CM | POA: Diagnosis not present

## 2018-06-15 DIAGNOSIS — C090 Malignant neoplasm of tonsillar fossa: Secondary | ICD-10-CM | POA: Diagnosis not present

## 2018-06-15 DIAGNOSIS — Z9181 History of falling: Secondary | ICD-10-CM | POA: Diagnosis not present

## 2018-06-15 DIAGNOSIS — F0280 Dementia in other diseases classified elsewhere without behavioral disturbance: Secondary | ICD-10-CM | POA: Diagnosis not present

## 2018-06-15 DIAGNOSIS — M26629 Arthralgia of temporomandibular joint, unspecified side: Secondary | ICD-10-CM | POA: Diagnosis not present

## 2018-06-15 DIAGNOSIS — I8312 Varicose veins of left lower extremity with inflammation: Secondary | ICD-10-CM | POA: Diagnosis not present

## 2018-06-15 DIAGNOSIS — M549 Dorsalgia, unspecified: Secondary | ICD-10-CM | POA: Diagnosis not present

## 2018-06-15 DIAGNOSIS — I10 Essential (primary) hypertension: Secondary | ICD-10-CM | POA: Diagnosis not present

## 2018-06-15 DIAGNOSIS — G301 Alzheimer's disease with late onset: Secondary | ICD-10-CM | POA: Diagnosis not present

## 2018-06-15 DIAGNOSIS — M170 Bilateral primary osteoarthritis of knee: Secondary | ICD-10-CM | POA: Diagnosis not present

## 2018-06-15 DIAGNOSIS — I509 Heart failure, unspecified: Secondary | ICD-10-CM | POA: Diagnosis not present

## 2018-06-15 DIAGNOSIS — Z1331 Encounter for screening for depression: Secondary | ICD-10-CM | POA: Diagnosis not present

## 2018-06-15 DIAGNOSIS — Z23 Encounter for immunization: Secondary | ICD-10-CM | POA: Diagnosis not present

## 2018-06-16 DIAGNOSIS — C44329 Squamous cell carcinoma of skin of other parts of face: Secondary | ICD-10-CM | POA: Diagnosis not present

## 2018-06-16 DIAGNOSIS — C090 Malignant neoplasm of tonsillar fossa: Secondary | ICD-10-CM | POA: Diagnosis not present

## 2018-06-16 DIAGNOSIS — Z510 Encounter for antineoplastic radiation therapy: Secondary | ICD-10-CM | POA: Diagnosis not present

## 2018-06-20 DIAGNOSIS — H26491 Other secondary cataract, right eye: Secondary | ICD-10-CM | POA: Diagnosis not present

## 2018-06-20 DIAGNOSIS — H04123 Dry eye syndrome of bilateral lacrimal glands: Secondary | ICD-10-CM | POA: Diagnosis not present

## 2018-06-20 DIAGNOSIS — Z48810 Encounter for surgical aftercare following surgery on the sense organs: Secondary | ICD-10-CM | POA: Diagnosis not present

## 2018-06-20 DIAGNOSIS — R935 Abnormal findings on diagnostic imaging of other abdominal regions, including retroperitoneum: Secondary | ICD-10-CM | POA: Diagnosis not present

## 2018-06-20 DIAGNOSIS — C090 Malignant neoplasm of tonsillar fossa: Secondary | ICD-10-CM | POA: Diagnosis not present

## 2018-06-20 DIAGNOSIS — Z961 Presence of intraocular lens: Secondary | ICD-10-CM | POA: Diagnosis not present

## 2018-06-20 DIAGNOSIS — E1165 Type 2 diabetes mellitus with hyperglycemia: Secondary | ICD-10-CM | POA: Diagnosis not present

## 2018-06-20 DIAGNOSIS — H02105 Unspecified ectropion of left lower eyelid: Secondary | ICD-10-CM | POA: Diagnosis not present

## 2018-06-20 DIAGNOSIS — Z23 Encounter for immunization: Secondary | ICD-10-CM | POA: Diagnosis not present

## 2018-06-20 DIAGNOSIS — Z7984 Long term (current) use of oral hypoglycemic drugs: Secondary | ICD-10-CM | POA: Diagnosis not present

## 2018-06-21 DIAGNOSIS — C090 Malignant neoplasm of tonsillar fossa: Secondary | ICD-10-CM | POA: Diagnosis not present

## 2018-06-22 DIAGNOSIS — C090 Malignant neoplasm of tonsillar fossa: Secondary | ICD-10-CM | POA: Diagnosis not present

## 2018-06-23 DIAGNOSIS — Z510 Encounter for antineoplastic radiation therapy: Secondary | ICD-10-CM | POA: Diagnosis not present

## 2018-06-23 DIAGNOSIS — C090 Malignant neoplasm of tonsillar fossa: Secondary | ICD-10-CM | POA: Diagnosis not present

## 2018-06-24 DIAGNOSIS — C090 Malignant neoplasm of tonsillar fossa: Secondary | ICD-10-CM | POA: Diagnosis not present

## 2018-06-24 DIAGNOSIS — Z510 Encounter for antineoplastic radiation therapy: Secondary | ICD-10-CM | POA: Diagnosis not present

## 2018-06-27 DIAGNOSIS — R7989 Other specified abnormal findings of blood chemistry: Secondary | ICD-10-CM | POA: Diagnosis not present

## 2018-06-27 DIAGNOSIS — C090 Malignant neoplasm of tonsillar fossa: Secondary | ICD-10-CM | POA: Diagnosis not present

## 2018-06-28 DIAGNOSIS — R600 Localized edema: Secondary | ICD-10-CM | POA: Diagnosis not present

## 2018-06-28 DIAGNOSIS — R2242 Localized swelling, mass and lump, left lower limb: Secondary | ICD-10-CM | POA: Diagnosis not present

## 2018-06-28 DIAGNOSIS — R2241 Localized swelling, mass and lump, right lower limb: Secondary | ICD-10-CM | POA: Diagnosis not present

## 2018-06-28 DIAGNOSIS — C090 Malignant neoplasm of tonsillar fossa: Secondary | ICD-10-CM | POA: Diagnosis not present

## 2018-06-29 DIAGNOSIS — C090 Malignant neoplasm of tonsillar fossa: Secondary | ICD-10-CM | POA: Diagnosis not present

## 2018-06-29 DIAGNOSIS — Z510 Encounter for antineoplastic radiation therapy: Secondary | ICD-10-CM | POA: Diagnosis not present

## 2018-06-30 DIAGNOSIS — C090 Malignant neoplasm of tonsillar fossa: Secondary | ICD-10-CM | POA: Diagnosis not present

## 2018-07-01 DIAGNOSIS — C090 Malignant neoplasm of tonsillar fossa: Secondary | ICD-10-CM | POA: Diagnosis not present

## 2018-07-01 DIAGNOSIS — Z510 Encounter for antineoplastic radiation therapy: Secondary | ICD-10-CM | POA: Diagnosis not present

## 2018-07-04 DIAGNOSIS — Z510 Encounter for antineoplastic radiation therapy: Secondary | ICD-10-CM | POA: Diagnosis not present

## 2018-07-04 DIAGNOSIS — C090 Malignant neoplasm of tonsillar fossa: Secondary | ICD-10-CM | POA: Diagnosis not present

## 2018-07-04 DIAGNOSIS — Z5111 Encounter for antineoplastic chemotherapy: Secondary | ICD-10-CM | POA: Diagnosis not present

## 2018-07-05 DIAGNOSIS — C090 Malignant neoplasm of tonsillar fossa: Secondary | ICD-10-CM | POA: Diagnosis not present

## 2018-07-05 DIAGNOSIS — Z510 Encounter for antineoplastic radiation therapy: Secondary | ICD-10-CM | POA: Diagnosis not present

## 2018-07-06 DIAGNOSIS — C090 Malignant neoplasm of tonsillar fossa: Secondary | ICD-10-CM | POA: Diagnosis not present

## 2018-07-06 DIAGNOSIS — Z510 Encounter for antineoplastic radiation therapy: Secondary | ICD-10-CM | POA: Diagnosis not present

## 2018-07-07 DIAGNOSIS — C090 Malignant neoplasm of tonsillar fossa: Secondary | ICD-10-CM | POA: Diagnosis not present

## 2018-07-07 DIAGNOSIS — Z510 Encounter for antineoplastic radiation therapy: Secondary | ICD-10-CM | POA: Diagnosis not present

## 2018-07-08 DIAGNOSIS — C090 Malignant neoplasm of tonsillar fossa: Secondary | ICD-10-CM | POA: Diagnosis not present

## 2018-07-08 DIAGNOSIS — Z510 Encounter for antineoplastic radiation therapy: Secondary | ICD-10-CM | POA: Diagnosis not present

## 2018-07-11 DIAGNOSIS — Z510 Encounter for antineoplastic radiation therapy: Secondary | ICD-10-CM | POA: Diagnosis not present

## 2018-07-11 DIAGNOSIS — C090 Malignant neoplasm of tonsillar fossa: Secondary | ICD-10-CM | POA: Diagnosis not present

## 2018-07-12 DIAGNOSIS — C090 Malignant neoplasm of tonsillar fossa: Secondary | ICD-10-CM | POA: Diagnosis not present

## 2018-07-13 DIAGNOSIS — C090 Malignant neoplasm of tonsillar fossa: Secondary | ICD-10-CM | POA: Diagnosis not present

## 2018-07-13 DIAGNOSIS — Z510 Encounter for antineoplastic radiation therapy: Secondary | ICD-10-CM | POA: Diagnosis not present

## 2018-07-14 DIAGNOSIS — C090 Malignant neoplasm of tonsillar fossa: Secondary | ICD-10-CM | POA: Diagnosis not present

## 2018-07-14 DIAGNOSIS — Z510 Encounter for antineoplastic radiation therapy: Secondary | ICD-10-CM | POA: Diagnosis not present

## 2018-07-15 DIAGNOSIS — C090 Malignant neoplasm of tonsillar fossa: Secondary | ICD-10-CM | POA: Diagnosis not present

## 2018-07-15 DIAGNOSIS — Z510 Encounter for antineoplastic radiation therapy: Secondary | ICD-10-CM | POA: Diagnosis not present

## 2018-07-18 DIAGNOSIS — C090 Malignant neoplasm of tonsillar fossa: Secondary | ICD-10-CM | POA: Diagnosis not present

## 2018-07-18 DIAGNOSIS — Z510 Encounter for antineoplastic radiation therapy: Secondary | ICD-10-CM | POA: Diagnosis not present

## 2018-07-19 DIAGNOSIS — Z510 Encounter for antineoplastic radiation therapy: Secondary | ICD-10-CM | POA: Diagnosis not present

## 2018-07-19 DIAGNOSIS — C090 Malignant neoplasm of tonsillar fossa: Secondary | ICD-10-CM | POA: Diagnosis not present

## 2018-07-20 DIAGNOSIS — C090 Malignant neoplasm of tonsillar fossa: Secondary | ICD-10-CM | POA: Diagnosis not present

## 2018-07-20 DIAGNOSIS — Z510 Encounter for antineoplastic radiation therapy: Secondary | ICD-10-CM | POA: Diagnosis not present

## 2018-07-21 DIAGNOSIS — Z510 Encounter for antineoplastic radiation therapy: Secondary | ICD-10-CM | POA: Diagnosis not present

## 2018-07-21 DIAGNOSIS — C090 Malignant neoplasm of tonsillar fossa: Secondary | ICD-10-CM | POA: Diagnosis not present

## 2018-07-22 DIAGNOSIS — C090 Malignant neoplasm of tonsillar fossa: Secondary | ICD-10-CM | POA: Diagnosis not present

## 2018-07-22 DIAGNOSIS — Z510 Encounter for antineoplastic radiation therapy: Secondary | ICD-10-CM | POA: Diagnosis not present

## 2018-07-25 DIAGNOSIS — C090 Malignant neoplasm of tonsillar fossa: Secondary | ICD-10-CM | POA: Diagnosis not present

## 2018-07-26 DIAGNOSIS — Z510 Encounter for antineoplastic radiation therapy: Secondary | ICD-10-CM | POA: Diagnosis not present

## 2018-07-26 DIAGNOSIS — C090 Malignant neoplasm of tonsillar fossa: Secondary | ICD-10-CM | POA: Diagnosis not present

## 2018-08-03 DIAGNOSIS — C090 Malignant neoplasm of tonsillar fossa: Secondary | ICD-10-CM | POA: Diagnosis not present

## 2018-08-10 DIAGNOSIS — C090 Malignant neoplasm of tonsillar fossa: Secondary | ICD-10-CM | POA: Diagnosis not present

## 2018-08-11 DIAGNOSIS — Z03818 Encounter for observation for suspected exposure to other biological agents ruled out: Secondary | ICD-10-CM | POA: Diagnosis not present

## 2018-08-11 DIAGNOSIS — R000 Tachycardia, unspecified: Secondary | ICD-10-CM | POA: Diagnosis not present

## 2018-08-11 DIAGNOSIS — Z7982 Long term (current) use of aspirin: Secondary | ICD-10-CM | POA: Diagnosis not present

## 2018-08-11 DIAGNOSIS — Z79899 Other long term (current) drug therapy: Secondary | ICD-10-CM | POA: Diagnosis not present

## 2018-08-11 DIAGNOSIS — R918 Other nonspecific abnormal finding of lung field: Secondary | ICD-10-CM | POA: Diagnosis not present

## 2018-08-11 DIAGNOSIS — C099 Malignant neoplasm of tonsil, unspecified: Secondary | ICD-10-CM | POA: Diagnosis not present

## 2018-08-11 DIAGNOSIS — G4700 Insomnia, unspecified: Secondary | ICD-10-CM | POA: Diagnosis not present

## 2018-08-11 DIAGNOSIS — J181 Lobar pneumonia, unspecified organism: Secondary | ICD-10-CM | POA: Diagnosis not present

## 2018-08-11 DIAGNOSIS — Z87891 Personal history of nicotine dependence: Secondary | ICD-10-CM | POA: Diagnosis not present

## 2018-08-11 DIAGNOSIS — R509 Fever, unspecified: Secondary | ICD-10-CM | POA: Diagnosis not present

## 2018-08-11 DIAGNOSIS — J189 Pneumonia, unspecified organism: Secondary | ICD-10-CM | POA: Diagnosis not present

## 2018-08-11 DIAGNOSIS — A419 Sepsis, unspecified organism: Secondary | ICD-10-CM | POA: Diagnosis not present

## 2018-08-18 DIAGNOSIS — Z9889 Other specified postprocedural states: Secondary | ICD-10-CM | POA: Diagnosis not present

## 2018-08-18 DIAGNOSIS — R066 Hiccough: Secondary | ICD-10-CM | POA: Diagnosis not present

## 2018-08-18 DIAGNOSIS — J189 Pneumonia, unspecified organism: Secondary | ICD-10-CM | POA: Diagnosis not present

## 2018-08-18 DIAGNOSIS — B370 Candidal stomatitis: Secondary | ICD-10-CM | POA: Diagnosis not present

## 2018-08-18 DIAGNOSIS — Z6820 Body mass index (BMI) 20.0-20.9, adult: Secondary | ICD-10-CM | POA: Diagnosis not present

## 2018-08-18 DIAGNOSIS — C099 Malignant neoplasm of tonsil, unspecified: Secondary | ICD-10-CM | POA: Diagnosis not present

## 2018-08-18 DIAGNOSIS — Z79899 Other long term (current) drug therapy: Secondary | ICD-10-CM | POA: Diagnosis not present

## 2018-08-18 DIAGNOSIS — G4700 Insomnia, unspecified: Secondary | ICD-10-CM | POA: Diagnosis not present

## 2018-08-18 DIAGNOSIS — R070 Pain in throat: Secondary | ICD-10-CM | POA: Diagnosis not present

## 2018-08-18 DIAGNOSIS — E7889 Other lipoprotein metabolism disorders: Secondary | ICD-10-CM | POA: Diagnosis not present

## 2018-08-18 DIAGNOSIS — R000 Tachycardia, unspecified: Secondary | ICD-10-CM | POA: Diagnosis not present

## 2018-08-18 DIAGNOSIS — D649 Anemia, unspecified: Secondary | ICD-10-CM | POA: Diagnosis not present

## 2018-08-25 DIAGNOSIS — R1310 Dysphagia, unspecified: Secondary | ICD-10-CM | POA: Diagnosis not present

## 2018-08-25 DIAGNOSIS — C090 Malignant neoplasm of tonsillar fossa: Secondary | ICD-10-CM | POA: Diagnosis not present

## 2018-09-05 DIAGNOSIS — C090 Malignant neoplasm of tonsillar fossa: Secondary | ICD-10-CM | POA: Diagnosis not present

## 2018-09-22 DIAGNOSIS — B002 Herpesviral gingivostomatitis and pharyngotonsillitis: Secondary | ICD-10-CM | POA: Diagnosis not present

## 2018-09-22 DIAGNOSIS — C090 Malignant neoplasm of tonsillar fossa: Secondary | ICD-10-CM | POA: Diagnosis not present

## 2018-10-03 DIAGNOSIS — E7889 Other lipoprotein metabolism disorders: Secondary | ICD-10-CM | POA: Diagnosis not present

## 2018-10-03 DIAGNOSIS — Z6821 Body mass index (BMI) 21.0-21.9, adult: Secondary | ICD-10-CM | POA: Diagnosis not present

## 2018-10-03 DIAGNOSIS — B001 Herpesviral vesicular dermatitis: Secondary | ICD-10-CM | POA: Diagnosis not present

## 2018-10-03 DIAGNOSIS — C099 Malignant neoplasm of tonsil, unspecified: Secondary | ICD-10-CM | POA: Diagnosis not present

## 2018-10-03 DIAGNOSIS — Z9889 Other specified postprocedural states: Secondary | ICD-10-CM | POA: Diagnosis not present

## 2018-10-26 DIAGNOSIS — C090 Malignant neoplasm of tonsillar fossa: Secondary | ICD-10-CM | POA: Diagnosis not present

## 2018-10-27 DIAGNOSIS — C090 Malignant neoplasm of tonsillar fossa: Secondary | ICD-10-CM | POA: Diagnosis not present

## 2018-10-27 DIAGNOSIS — Z85818 Personal history of malignant neoplasm of other sites of lip, oral cavity, and pharynx: Secondary | ICD-10-CM | POA: Diagnosis not present

## 2018-10-31 DIAGNOSIS — Z931 Gastrostomy status: Secondary | ICD-10-CM | POA: Insufficient documentation

## 2018-10-31 DIAGNOSIS — Z452 Encounter for adjustment and management of vascular access device: Secondary | ICD-10-CM | POA: Diagnosis not present

## 2018-10-31 HISTORY — DX: Gastrostomy status: Z93.1

## 2018-11-02 DIAGNOSIS — H9190 Unspecified hearing loss, unspecified ear: Secondary | ICD-10-CM | POA: Diagnosis not present

## 2018-11-02 DIAGNOSIS — Z87891 Personal history of nicotine dependence: Secondary | ICD-10-CM | POA: Diagnosis not present

## 2018-11-02 DIAGNOSIS — Z85818 Personal history of malignant neoplasm of other sites of lip, oral cavity, and pharynx: Secondary | ICD-10-CM | POA: Diagnosis not present

## 2018-11-02 DIAGNOSIS — Z9221 Personal history of antineoplastic chemotherapy: Secondary | ICD-10-CM | POA: Diagnosis not present

## 2018-11-02 DIAGNOSIS — Z923 Personal history of irradiation: Secondary | ICD-10-CM | POA: Diagnosis not present

## 2018-11-02 DIAGNOSIS — C779 Secondary and unspecified malignant neoplasm of lymph node, unspecified: Secondary | ICD-10-CM | POA: Diagnosis not present

## 2018-11-21 DIAGNOSIS — Z9889 Other specified postprocedural states: Secondary | ICD-10-CM | POA: Diagnosis not present

## 2018-11-21 DIAGNOSIS — E7889 Other lipoprotein metabolism disorders: Secondary | ICD-10-CM | POA: Diagnosis not present

## 2018-11-21 DIAGNOSIS — Z85818 Personal history of malignant neoplasm of other sites of lip, oral cavity, and pharynx: Secondary | ICD-10-CM | POA: Diagnosis not present

## 2018-11-21 DIAGNOSIS — I739 Peripheral vascular disease, unspecified: Secondary | ICD-10-CM | POA: Diagnosis not present

## 2018-11-21 DIAGNOSIS — R7309 Other abnormal glucose: Secondary | ICD-10-CM | POA: Diagnosis not present

## 2018-12-08 ENCOUNTER — Other Ambulatory Visit: Payer: Self-pay

## 2018-12-08 ENCOUNTER — Encounter: Payer: Self-pay | Admitting: Cardiology

## 2018-12-08 ENCOUNTER — Ambulatory Visit (INDEPENDENT_AMBULATORY_CARE_PROVIDER_SITE_OTHER): Payer: PPO | Admitting: Cardiology

## 2018-12-08 VITALS — BP 120/70 | HR 92 | Ht 68.0 in | Wt 143.0 lb

## 2018-12-08 DIAGNOSIS — Z87891 Personal history of nicotine dependence: Secondary | ICD-10-CM | POA: Diagnosis not present

## 2018-12-08 DIAGNOSIS — E782 Mixed hyperlipidemia: Secondary | ICD-10-CM

## 2018-12-08 DIAGNOSIS — I714 Abdominal aortic aneurysm, without rupture, unspecified: Secondary | ICD-10-CM

## 2018-12-08 HISTORY — DX: Mixed hyperlipidemia: E78.2

## 2018-12-08 NOTE — Progress Notes (Signed)
Cardiology Office Note:    Date:  12/08/2018   ID:  Cody Benjamin, DOB 1945/12/21, MRN [id number]  PCP:  Ocie Doyne., MD  Cardiologist:  Jenean Lindau, MD   Referring MD: Ocie Doyne., MD    ASSESSMENT:    1. AAA (abdominal aortic aneurysm) without rupture ([place])   2. Ex-cigarette smoker   3. Mixed dyslipidemia    PLAN:    In order of problems listed above:  1. Atherosclerotic vascular disease: Secondary prevention stressed with the patient.  Importance of compliance with diet and medication stressed and he vocalized understanding.  His blood pressure stable. 2. Mixed dyslipidemia: Diet was discussed and he will be back in the next few days for lipid check.  He has history of smoking and promises never to go back. 3. Patient will be seen in follow-up appointment in 6 months or earlier if the patient has any concerns    Medication Adjustments/Labs and Tests Ordered: Current medicines are reviewed at length with the patient today.  Concerns regarding medicines are outlined above.  No orders of the defined types were placed in this encounter.  No orders of the defined types were placed in this encounter.    Chief Complaint  Patient presents with  . Follow-up     History of Present Illness:    Cody Benjamin is a 73 y.o. male.  Patient has past medical history of abdominal aneurysm post stenting last year.  He underwent treatment for throat cancer.  Subsequently is done fine.  No chest pain orthopnea or PND.  At the time of my evaluation, the patient is alert awake oriented and in no distress.  He walks on a regular basis without any symptoms.  Past Medical History:  Diagnosis Date  . Arthritis    right shoulder  . BPH (benign prostatic hyperplasia)   . Chronic kidney disease    history of kidney stones  . Drug therapy   . Elevated blood sugar   . Fever   . History of kidney stones   . Lipids abnormal   . Pneumonia     Past Surgical History:  Procedure  Laterality Date  . ABDOMINAL AORTIC ENDOVASCULAR STENT GRAFT N/A 12/27/2017   Procedure: ABDOMINAL AORTIC ENDOVASCULAR STENT GRAFT;  Surgeon: Elam Dutch, MD;  Location: [facility] OR;  Service: Vascular;  Laterality: N/A;  . COLONOSCOPY    . SHOULDER SURGERY Right 1969    Current Medications: Current Meds  Medication Sig  . acetaminophen (TYLENOL) 500 MG tablet Take 1,000 mg by mouth every 6 (six) hours as needed for moderate pain or headache.  Marland Kitchen aspirin EC 81 MG tablet Take 81 mg by mouth daily.  Marland Kitchen atorvastatin (LIPITOR) 20 MG tablet Take 1 tablet (20 mg total) by mouth daily.  . [DISCONTINUED] diphenhydrAMINE (BENADRYL) 25 mg capsule Take 25 mg by mouth daily as needed for allergies.     Allergies:   Levofloxacin   Social History   Socioeconomic History  . Marital status: Married    Spouse name: Not on file  . Number of children: Not on file  . Years of education: Not on file  . Highest education level: Not on file  Occupational History  . Not on file  Social Needs  . Financial resource strain: Not on file  . Food insecurity    Worry: Not on file    Inability: Not on file  . Transportation needs    Medical: Not on file  Non-medical: Not on file  Tobacco Use  . Smoking status: Former Smoker    Quit date: 03/09/2013    Years since quitting: 5.7  . Smokeless tobacco: Never Used  Substance and Sexual Activity  . Alcohol use: Never    Frequency: Never  . Drug use: Never  . Sexual activity: Not on file  Lifestyle  . Physical activity    Days per week: Not on file    Minutes per session: Not on file  . Stress: Not on file  Relationships  . Social Herbalist on phone: Not on file    Gets together: Not on file    Attends religious service: Not on file    Active member of club or organization: Not on file    Attends meetings of clubs or organizations: Not on file    Relationship status: Not on file  Other Topics Concern  . Not on file  Social History  Narrative  . Not on file     Family History: The patient's family history includes Danil cancer in his mother; Seizures in his brother.  ROS:   Please see the history of present illness.    All other systems reviewed and are negative.  EKGs/Labs/Other Studies Reviewed:    The following studies were reviewed today: EKG revealed sinus rhythm and nonspecific ST-T changes.   Recent Labs: 12/27/2017: Magnesium 1.6 12/28/2017: Hemoglobin 10.9; Platelets 195 03/17/2018: ALT 48; BUN 16; Creatinine, Ser 1.41; Potassium 4.2; Sodium 143; TSH 2.880  Recent Lipid Panel    Component Value Date/Time   CHOL 186 03/17/2018 1145   TRIG 125 03/17/2018 1145   HDL 41 03/17/2018 1145   CHOLHDL 4.5 03/17/2018 1145   LDLCALC 120 (H) 03/17/2018 1145    Physical Exam:    VS:  BP 120/70 (BP Location: Right Arm, Patient Position: Sitting, Cuff Size: Normal)   Pulse 92   Ht 5\' 8"  (1.727 m)   Wt 143 lb (64.9 kg)   SpO2 97%   BMI 21.74 kg/m     Wt Readings from Last 3 Encounters:  12/08/18 143 lb (64.9 kg)  03/17/18 154 lb (69.9 kg)  01/27/18 146 lb (66.2 kg)     GEN: Patient is in no acute distress HEENT: Normal NECK: No JVD; No carotid bruits LYMPHATICS: No lymphadenopathy CARDIAC: Hear sounds regular, 2/6 systolic murmur at the apex. RESPIRATORY:  Clear to auscultation without rales, wheezing or rhonchi  ABDOMEN: Soft, non-tender, non-distended MUSCULOSKELETAL:  No edema; No deformity  SKIN: Warm and dry NEUROLOGIC:  Alert and oriented x 3 PSYCHIATRIC:  Normal affect   Signed, Jenean Lindau, MD  12/08/2018 4:07 PM     [facility]

## 2018-12-08 NOTE — Addendum Note (Signed)
Addended by: Beckey Rutter on: 12/08/2018 04:37 PM   Modules accepted: Orders

## 2018-12-08 NOTE — Patient Instructions (Signed)
Medication Instructions:  Your physician recommends that you continue on your current medications as directed. Please refer to the Current Medication list given to you today.  If you need a refill on your cardiac medications before your next appointment, please call your pharmacy.   Lab work: Your physician recommends that you return for lab work in: Liver and lipid  If you have labs (blood work) drawn today and your tests are completely normal, you will receive your results only by: Marland Kitchen MyChart Message (if you have MyChart) OR . A paper copy in the mail If you have any lab test that is abnormal or we need to change your treatment, we will call you to review the results.  Testing/Procedures: None  Follow-Up: At [facility], you and your health needs are our priority.  As part of our continuing mission to provide you with exceptional heart care, we have created designated Provider Care Teams.  These Care Teams include your primary Cardiologist (physician) and Advanced Practice Providers (APPs -  Physician Assistants and Nurse Practitioners) who all work together to provide you with the care you need, when you need it. You will need a follow up appointment in 6 months.  Please call our office 2 months in advance to schedule this appointment.  Any Other Special Instructions Will Be Listed Below (If Applicable).

## 2018-12-08 NOTE — Addendum Note (Signed)
Addended by: Jerl Santos R on: 12/08/2018 04:22 PM   Modules accepted: Orders

## 2018-12-14 DIAGNOSIS — I714 Abdominal aortic aneurysm, without rupture: Secondary | ICD-10-CM | POA: Diagnosis not present

## 2018-12-14 DIAGNOSIS — E782 Mixed hyperlipidemia: Secondary | ICD-10-CM | POA: Diagnosis not present

## 2018-12-14 LAB — HEPATIC FUNCTION PANEL
ALT: 14 IU/L (ref 0–44)
AST: 23 IU/L (ref 0–40)
Albumin: 4.6 g/dL (ref 3.7–4.7)
Alkaline Phosphatase: 110 IU/L (ref 39–117)
Bilirubin Total: 0.3 mg/dL (ref 0.0–1.2)
Bilirubin, Direct: 0.12 mg/dL (ref 0.00–0.40)
Total Protein: 6.8 g/dL (ref 6.0–8.5)

## 2018-12-14 LAB — LIPID PANEL
Chol/HDL Ratio: 4.7 ratio (ref 0.0–5.0)
Cholesterol, Total: 166 mg/dL (ref 100–199)
HDL: 35 mg/dL — ABNORMAL LOW (ref >39)
LDL Chol Calc (NIH): 111 mg/dL — ABNORMAL HIGH (ref 0–99)
Triglycerides: 110 mg/dL (ref 0–149)
VLDL Cholesterol Cal: 20 mg/dL (ref 5–40)

## 2018-12-15 ENCOUNTER — Telehealth: Payer: Self-pay

## 2018-12-15 DIAGNOSIS — E782 Mixed hyperlipidemia: Secondary | ICD-10-CM

## 2018-12-15 MED ORDER — ATORVASTATIN CALCIUM 20 MG PO TABS: 40.0000 mg | ORAL_TABLET | Freq: Every day | ORAL | 2 refills | Status: DC

## 2018-12-15 NOTE — Telephone Encounter (Signed)
Left message for patient to call back for results.  

## 2018-12-15 NOTE — Addendum Note (Signed)
Addended by: Beckey Rutter on: 12/15/2018 02:54 PM   Modules accepted: Orders

## 2018-12-15 NOTE — Addendum Note (Signed)
Addended by: Beckey Rutter on: 12/15/2018 02:53 PM   Modules accepted: Orders

## 2018-12-15 NOTE — Telephone Encounter (Signed)
Information relayed patient increasing atorvastatin to 40 mg daily and will come back for repeat labs. No further questions.

## 2018-12-15 NOTE — Telephone Encounter (Signed)
-----   Message from Jenean Lindau, MD sent at 12/15/2018 12:08 PM EDT ----- Diet, double statin and liver lipid check in 6 weeks Jenean Lindau, MD 12/15/2018 12:08 PM

## 2018-12-29 ENCOUNTER — Telehealth: Payer: Self-pay | Admitting: Cardiology

## 2018-12-29 MED ORDER — ATORVASTATIN CALCIUM 40 MG PO TABS: 40.0000 mg | ORAL_TABLET | Freq: Every day | ORAL | 2 refills | Status: DC

## 2018-12-29 NOTE — Addendum Note (Signed)
Addended by: Beckey Rutter on: 12/29/2018 09:46 AM   Modules accepted: Orders

## 2018-12-29 NOTE — Telephone Encounter (Signed)
°*  STAT* If patient is at the pharmacy, call can be transferred to refill team.   1. Which medications need to be refilled? (please list name of each medication and dose if known) Atorvastatin with the new directions for 40mg   2. Which pharmacy/location (including street and city if local pharmacy) is medication to be sent to?[facility] on [address]  3. Do they need a 30 day or 90 day supply? [place]

## 2019-01-07 ENCOUNTER — Other Ambulatory Visit: Payer: Self-pay | Admitting: Cardiology

## 2019-01-30 DIAGNOSIS — Z9221 Personal history of antineoplastic chemotherapy: Secondary | ICD-10-CM | POA: Diagnosis not present

## 2019-01-30 DIAGNOSIS — R112 Nausea with vomiting, unspecified: Secondary | ICD-10-CM | POA: Diagnosis not present

## 2019-01-30 DIAGNOSIS — Z923 Personal history of irradiation: Secondary | ICD-10-CM | POA: Diagnosis not present

## 2019-01-30 DIAGNOSIS — Z85818 Personal history of malignant neoplasm of other sites of lip, oral cavity, and pharynx: Secondary | ICD-10-CM | POA: Diagnosis not present

## 2019-01-30 DIAGNOSIS — Z20828 Contact with and (suspected) exposure to other viral communicable diseases: Secondary | ICD-10-CM | POA: Diagnosis not present

## 2019-01-30 DIAGNOSIS — L089 Local infection of the skin and subcutaneous tissue, unspecified: Secondary | ICD-10-CM | POA: Diagnosis not present

## 2019-01-30 DIAGNOSIS — R000 Tachycardia, unspecified: Secondary | ICD-10-CM | POA: Diagnosis not present

## 2019-01-30 DIAGNOSIS — R432 Parageusia: Secondary | ICD-10-CM | POA: Diagnosis not present

## 2019-02-07 DIAGNOSIS — L03012 Cellulitis of left finger: Secondary | ICD-10-CM | POA: Diagnosis not present

## 2019-02-07 DIAGNOSIS — B349 Viral infection, unspecified: Secondary | ICD-10-CM | POA: Diagnosis not present

## 2019-02-09 DIAGNOSIS — J439 Emphysema, unspecified: Secondary | ICD-10-CM | POA: Diagnosis not present

## 2019-02-09 DIAGNOSIS — R531 Weakness: Secondary | ICD-10-CM | POA: Diagnosis not present

## 2019-02-09 DIAGNOSIS — E7889 Other lipoprotein metabolism disorders: Secondary | ICD-10-CM | POA: Diagnosis not present

## 2019-02-09 DIAGNOSIS — E162 Hypoglycemia, unspecified: Secondary | ICD-10-CM | POA: Diagnosis not present

## 2019-02-09 DIAGNOSIS — Z79899 Other long term (current) drug therapy: Secondary | ICD-10-CM | POA: Diagnosis not present

## 2019-02-09 DIAGNOSIS — I959 Hypotension, unspecified: Secondary | ICD-10-CM | POA: Diagnosis not present

## 2019-02-09 DIAGNOSIS — I739 Peripheral vascular disease, unspecified: Secondary | ICD-10-CM | POA: Diagnosis not present

## 2019-02-09 DIAGNOSIS — R079 Chest pain, unspecified: Secondary | ICD-10-CM | POA: Diagnosis not present

## 2019-02-09 DIAGNOSIS — R7309 Other abnormal glucose: Secondary | ICD-10-CM | POA: Diagnosis not present

## 2019-02-10 DIAGNOSIS — R7989 Other specified abnormal findings of blood chemistry: Secondary | ICD-10-CM | POA: Diagnosis not present

## 2019-02-10 DIAGNOSIS — I959 Hypotension, unspecified: Secondary | ICD-10-CM | POA: Diagnosis not present

## 2019-02-10 DIAGNOSIS — R0602 Shortness of breath: Secondary | ICD-10-CM | POA: Diagnosis not present

## 2019-02-10 DIAGNOSIS — R531 Weakness: Secondary | ICD-10-CM | POA: Diagnosis not present

## 2019-02-10 DIAGNOSIS — R079 Chest pain, unspecified: Secondary | ICD-10-CM | POA: Diagnosis not present

## 2019-02-16 DIAGNOSIS — Z9889 Other specified postprocedural states: Secondary | ICD-10-CM | POA: Diagnosis not present

## 2019-02-16 DIAGNOSIS — E7889 Other lipoprotein metabolism disorders: Secondary | ICD-10-CM | POA: Diagnosis not present

## 2019-02-16 DIAGNOSIS — R7989 Other specified abnormal findings of blood chemistry: Secondary | ICD-10-CM | POA: Diagnosis not present

## 2019-02-16 DIAGNOSIS — R0602 Shortness of breath: Secondary | ICD-10-CM | POA: Diagnosis not present

## 2019-02-16 DIAGNOSIS — Z6820 Body mass index (BMI) 20.0-20.9, adult: Secondary | ICD-10-CM | POA: Diagnosis not present

## 2019-02-20 DIAGNOSIS — R079 Chest pain, unspecified: Secondary | ICD-10-CM | POA: Diagnosis not present

## 2019-02-20 DIAGNOSIS — I313 Pericardial effusion (noninflammatory): Secondary | ICD-10-CM | POA: Diagnosis not present

## 2019-02-22 DIAGNOSIS — Z1212 Encounter for screening for malignant neoplasm of rectum: Secondary | ICD-10-CM | POA: Diagnosis not present

## 2019-02-27 DIAGNOSIS — C090 Malignant neoplasm of tonsillar fossa: Secondary | ICD-10-CM | POA: Diagnosis not present

## 2019-02-27 DIAGNOSIS — Z85818 Personal history of malignant neoplasm of other sites of lip, oral cavity, and pharynx: Secondary | ICD-10-CM | POA: Diagnosis not present

## 2019-02-27 DIAGNOSIS — Z79899 Other long term (current) drug therapy: Secondary | ICD-10-CM | POA: Diagnosis not present

## 2019-03-30 DIAGNOSIS — D649 Anemia, unspecified: Secondary | ICD-10-CM | POA: Diagnosis not present

## 2019-03-30 DIAGNOSIS — Z86010 Personal history of colonic polyps: Secondary | ICD-10-CM | POA: Diagnosis not present

## 2019-03-30 DIAGNOSIS — Z1211 Encounter for screening for malignant neoplasm of colon: Secondary | ICD-10-CM | POA: Diagnosis not present

## 2019-04-19 DIAGNOSIS — D128 Benign neoplasm of rectum: Secondary | ICD-10-CM | POA: Diagnosis not present

## 2019-04-19 DIAGNOSIS — K635 Polyp of colon: Secondary | ICD-10-CM | POA: Diagnosis not present

## 2019-04-19 DIAGNOSIS — Z1211 Encounter for screening for malignant neoplasm of colon: Secondary | ICD-10-CM | POA: Diagnosis not present

## 2019-04-19 DIAGNOSIS — D649 Anemia, unspecified: Secondary | ICD-10-CM | POA: Diagnosis not present

## 2019-04-19 DIAGNOSIS — Z86010 Personal history of colonic polyps: Secondary | ICD-10-CM | POA: Diagnosis not present

## 2019-04-19 DIAGNOSIS — K621 Rectal polyp: Secondary | ICD-10-CM | POA: Diagnosis not present

## 2019-04-19 DIAGNOSIS — Z800 Family history of malignant neoplasm of digestive organs: Secondary | ICD-10-CM | POA: Diagnosis not present

## 2019-05-05 DIAGNOSIS — Z9221 Personal history of antineoplastic chemotherapy: Secondary | ICD-10-CM | POA: Diagnosis not present

## 2019-05-05 DIAGNOSIS — H9190 Unspecified hearing loss, unspecified ear: Secondary | ICD-10-CM | POA: Diagnosis not present

## 2019-05-05 DIAGNOSIS — R432 Parageusia: Secondary | ICD-10-CM | POA: Diagnosis not present

## 2019-05-05 DIAGNOSIS — Z923 Personal history of irradiation: Secondary | ICD-10-CM | POA: Diagnosis not present

## 2019-05-05 DIAGNOSIS — Z85818 Personal history of malignant neoplasm of other sites of lip, oral cavity, and pharynx: Secondary | ICD-10-CM | POA: Diagnosis not present

## 2019-06-11 DIAGNOSIS — B9689 Other specified bacterial agents as the cause of diseases classified elsewhere: Secondary | ICD-10-CM | POA: Diagnosis not present

## 2019-06-11 DIAGNOSIS — J439 Emphysema, unspecified: Secondary | ICD-10-CM | POA: Diagnosis not present

## 2019-06-11 DIAGNOSIS — J069 Acute upper respiratory infection, unspecified: Secondary | ICD-10-CM | POA: Diagnosis not present

## 2019-06-26 ENCOUNTER — Other Ambulatory Visit: Payer: Self-pay

## 2019-06-26 ENCOUNTER — Encounter: Payer: Self-pay | Admitting: Cardiology

## 2019-06-26 ENCOUNTER — Ambulatory Visit: Payer: PPO | Admitting: Cardiology

## 2019-06-26 VITALS — BP 124/68 | HR 80 | Temp 97.4°F | Ht 68.0 in | Wt 148.0 lb

## 2019-06-26 DIAGNOSIS — Z87891 Personal history of nicotine dependence: Secondary | ICD-10-CM | POA: Diagnosis not present

## 2019-06-26 DIAGNOSIS — E782 Mixed hyperlipidemia: Secondary | ICD-10-CM | POA: Diagnosis not present

## 2019-06-26 DIAGNOSIS — Z79899 Other long term (current) drug therapy: Secondary | ICD-10-CM | POA: Diagnosis not present

## 2019-06-26 DIAGNOSIS — I714 Abdominal aortic aneurysm, without rupture, unspecified: Secondary | ICD-10-CM

## 2019-06-26 NOTE — Patient Instructions (Signed)
Medication Instructions:  No medication changes *If you need a refill on your cardiac medications before your next appointment, please call your pharmacy*   Lab Work: Your physician recommends that you have labs today in the office. You had a BMET, CBC, LFT's, TSH and Lipids.  If you have labs (blood work) drawn today and your tests are completely normal, you will receive your results only by: Marland Kitchen MyChart Message (if you have MyChart) OR . A paper copy in the mail If you have any lab test that is abnormal or we need to change your treatment, we will call you to review the results.   Testing/Procedures: Your physician has requested that you have an abdominal aorta duplex. During this test, an ultrasound is used to evaluate the aorta. Allow 30 minutes for this exam. Do not eat after midnight the day before and avoid carbonated beverages   Follow-Up: At [facility], you and your health needs are our priority.  As part of our continuing mission to provide you with exceptional heart care, we have created designated Provider Care Teams.  These Care Teams include your primary Cardiologist (physician) and Advanced Practice Providers (APPs -  Physician Assistants and Nurse Practitioners) who all work together to provide you with the care you need, when you need it.  We recommend signing up for the patient portal called "MyChart".  Sign up information is provided on this After Visit Summary.  MyChart is used to connect with patients for Virtual Visits (Telemedicine).  Patients are able to view lab/test results, encounter notes, upcoming appointments, etc.  Non-urgent messages can be sent to your provider as well.   To learn more about what you can do with MyChart, go to [email or web address].    Your next appointment:   6 month(s)  The format for your next appointment:   In Person  Provider:   Jyl Heinz, MD   Other Instructions NA

## 2019-06-26 NOTE — Progress Notes (Signed)
Cardiology Office Note:    Date:  06/26/2019   ID:  Cody Benjamin, DOB September 09, 1945, MRN [id number]  PCP:  Ocie Doyne., MD (Inactive)  Cardiologist:  Jenean Lindau, MD   Referring MD: No ref. provider found    ASSESSMENT:    1. Abdominal aortic aneurysm (AAA) without rupture ([place])   2. Ex-cigarette smoker   3. Mixed dyslipidemia    PLAN:    In order of problems listed above:  1. Primary prevention stressed with the patient.  Importance of compliance with diet and medication stressed and he vocalized understanding.  His blood pressure is stable 2. Mixed dyslipidemia: Blood work will be done today.  I will do complete blood including fasting lipids 3. History of smoking: Patient has never gone back to smoking and I congratulated him about it. 4. Abdominal aortic aneurysm post repair: I will do a ultrasound of his abdomen to assess this and if necessary we will refer him to our vascular colleagues.  He is asymptomatic at this time. 5. History of tonsil cancer followed by our oncologist.  He tells me he is in remission. 6. Patient will be seen in follow-up appointment in 3 months or earlier if the patient has any concerns    Medication Adjustments/Labs and Tests Ordered: Current medicines are reviewed at length with the patient today.  Concerns regarding medicines are outlined above.  No orders of the defined types were placed in this encounter.  No orders of the defined types were placed in this encounter.    No chief complaint on file.    History of Present Illness:    Cody Benjamin is a 74 y.o. male.  Patient has past medical history of abdominal aortic aneurysm post repair and mixed dyslipidemia.  He denies any problems at this time and takes care of activities of daily living.  He does not exercise on a regular basis but golfs and also plays around with his grandchildren without any problem.  At the time of my evaluation, the patient is alert awake oriented and in no  distress.  Past Medical History:  Diagnosis Date  . Arthritis    right shoulder  . BPH (benign prostatic hyperplasia)   . Chronic kidney disease    history of kidney stones  . Drug therapy   . Elevated blood sugar   . Fever   . History of kidney stones   . Lipids abnormal   . Pneumonia     Past Surgical History:  Procedure Laterality Date  . ABDOMINAL AORTIC ENDOVASCULAR STENT GRAFT N/A 12/27/2017   Procedure: ABDOMINAL AORTIC ENDOVASCULAR STENT GRAFT;  Surgeon: Elam Dutch, MD;  Location: [facility] OR;  Service: Vascular;  Laterality: N/A;  . COLONOSCOPY    . SHOULDER SURGERY Right 1969    Current Medications: Current Meds  Medication Sig  . acetaminophen (TYLENOL) 500 MG tablet Take 1,000 mg by mouth every 6 (six) hours as needed for moderate pain or headache.  Marland Kitchen aspirin EC 81 MG tablet Take 81 mg by mouth daily.     Allergies:   Levofloxacin   Social History   Socioeconomic History  . Marital status: Married    Spouse name: Not on file  . Number of children: Not on file  . Years of education: Not on file  . Highest education level: Not on file  Occupational History  . Not on file  Tobacco Use  . Smoking status: Former Smoker    Quit date: 03/09/2013  Years since quitting: 6.3  . Smokeless tobacco: Never Used  Substance and Sexual Activity  . Alcohol use: Never  . Drug use: Never  . Sexual activity: Not on file  Other Topics Concern  . Not on file  Social History Narrative  . Not on file   Social Determinants of Health   Financial Resource Strain:   . Difficulty of Paying Living Expenses:   Food Insecurity:   . Worried About Charity fundraiser in the Last Year:   . Arboriculturist in the Last Year:   Transportation Needs:   . Film/video editor (Medical):   Marland Kitchen Lack of Transportation (Non-Medical):   Physical Activity:   . Days of Exercise per Week:   . Minutes of Exercise per Session:   Stress:   . Feeling of Stress :   Social Connections:    . Frequency of Communication with Friends and Family:   . Frequency of Social Gatherings with Friends and Family:   . Attends Religious Services:   . Active Member of Clubs or Organizations:   . Attends Archivist Meetings:   Marland Kitchen Marital Status:      Family History: The patient's family history includes Arif cancer in his mother; Seizures in his brother.  ROS:   Please see the history of present illness.    All other systems reviewed and are negative.  EKGs/Labs/Other Studies Reviewed:    The following studies were reviewed today: IMPRESSION: VASCULAR  1. Post endovascular repair of infrarenal abdominal aortic aneurysm without evidence of an endoleak. The native abdominal aortic aneurysm is unchanged in size measuring 6.4 cm in maximal diameter. Aortic aneurysm NOS (ICD10-I71.9). 2. Approximately 1 cm partially thrombosed presumed access site related pseudoaneurysm of left common femoral artery. 3. High-density material adjacent to the right common femoral arterial access site, indeterminate though potentially representative of an evolving hematoma. No definitive contrast extravasation. 4. Mild beaded irregularity of the left renal artery without evidence of a hemodynamically significant stenosis, nonspecific though could be seen in the setting mild FMD. 5.  Aortic Atherosclerosis (ICD10-I70.0).  NON-VASCULAR  1. Advanced emphysematous change within the imaged lung bases. Emphysema (ICD10-J43.9). 2. Cholelithiasis/biliary sludge without evidence of cholecystitis. 3. Previously questioned prominent mesenteric lymph node appears less conspicuous on the present examination and thus was presumably reactive in etiology.   Electronically Signed   By: Sandi Mariscal M.D.   On: 01/21/2018 12:21   Recent Labs: 12/14/2018: ALT 14  Recent Lipid Panel    Component Value Date/Time   CHOL 166 12/14/2018 0815   TRIG 110 12/14/2018 0815   HDL 35 (L) 12/14/2018  0815   CHOLHDL 4.7 12/14/2018 0815   LDLCALC 111 (H) 12/14/2018 0815    Physical Exam:    VS:  BP 124/68   Pulse 80   Temp (!) 97.4 F (36.3 C)   Ht 5\' 8"  (1.727 m)   Wt 148 lb (67.1 kg)   SpO2 96%   BMI 22.50 kg/m     Wt Readings from Last 3 Encounters:  06/26/19 148 lb (67.1 kg)  12/08/18 143 lb (64.9 kg)  03/17/18 154 lb (69.9 kg)     GEN: Patient is in no acute distress HEENT: Normal NECK: No JVD; No carotid bruits LYMPHATICS: No lymphadenopathy CARDIAC: Hear sounds regular, 2/6 systolic murmur at the apex. RESPIRATORY:  Clear to auscultation without rales, wheezing or rhonchi  ABDOMEN: Soft, non-tender, non-distended MUSCULOSKELETAL:  No edema; No deformity  SKIN: Warm  and dry NEUROLOGIC:  Alert and oriented x 3 PSYCHIATRIC:  Normal affect   Signed, Jenean Lindau, MD  06/26/2019 10:14 AM    [place]

## 2019-06-27 LAB — LIPID PANEL
Chol/HDL Ratio: 4.6 ratio (ref 0.0–5.0)
Cholesterol, Total: 165 mg/dL (ref 100–199)
HDL: 36 mg/dL — ABNORMAL LOW (ref >39)
LDL Chol Calc (NIH): 107 mg/dL — ABNORMAL HIGH (ref 0–99)
Triglycerides: 121 mg/dL (ref 0–149)
VLDL Cholesterol Cal: 22 mg/dL (ref 5–40)

## 2019-06-27 LAB — HEPATIC FUNCTION PANEL
ALT: 12 IU/L (ref 0–44)
AST: 18 IU/L (ref 0–40)
Albumin: 4.4 g/dL (ref 3.7–4.7)
Alkaline Phosphatase: 124 IU/L — ABNORMAL HIGH (ref 39–117)
Bilirubin Total: 0.4 mg/dL (ref 0.0–1.2)
Bilirubin, Direct: 0.12 mg/dL (ref 0.00–0.40)
Total Protein: 6.9 g/dL (ref 6.0–8.5)

## 2019-06-27 LAB — CBC WITH DIFFERENTIAL/PLATELET
Basophils Absolute: 0.1 10*3/uL (ref 0.0–0.2)
Basos: 1 %
EOS (ABSOLUTE): 0.1 10*3/uL (ref 0.0–0.4)
Eos: 3 %
Hematocrit: 43.9 % (ref 37.5–51.0)
Hemoglobin: 15.1 g/dL (ref 13.0–17.7)
Immature Grans (Abs): 0 10*3/uL (ref 0.0–0.1)
Immature Granulocytes: 0 %
Lymphocytes Absolute: 1.1 10*3/uL (ref 0.7–3.1)
Lymphs: 28 %
MCH: 29.8 pg (ref 26.6–33.0)
MCHC: 34.4 g/dL (ref 31.5–35.7)
MCV: 87 fL (ref 79–97)
Monocytes Absolute: 0.6 10*3/uL (ref 0.1–0.9)
Monocytes: 14 %
Neutrophils Absolute: 2.2 10*3/uL (ref 1.4–7.0)
Neutrophils: 54 %
Platelets: 219 10*3/uL (ref 150–450)
RBC: 5.07 x10E6/uL (ref 4.14–5.80)
RDW: 13.2 % (ref 11.6–15.4)
WBC: 4.1 10*3/uL (ref 3.4–10.8)

## 2019-06-27 LAB — BASIC METABOLIC PANEL
BUN/Creatinine Ratio: 10 (ref 10–24)
BUN: 11 mg/dL (ref 8–27)
CO2: 22 mmol/L (ref 20–29)
Calcium: 9.9 mg/dL (ref 8.6–10.2)
Chloride: 104 mmol/L (ref 96–106)
Creatinine, Ser: 1.12 mg/dL (ref 0.76–1.27)
GFR calc Af Amer: 75 mL/min/{1.73_m2} (ref >59)
GFR calc non Af Amer: 65 mL/min/{1.73_m2} (ref >59)
Glucose: 87 mg/dL (ref 65–99)
Potassium: 4.4 mmol/L (ref 3.5–5.2)
Sodium: 141 mmol/L (ref 134–144)

## 2019-06-27 LAB — TSH: TSH: 5.1 u[IU]/mL — ABNORMAL HIGH (ref 0.450–4.500)

## 2019-06-28 DIAGNOSIS — C090 Malignant neoplasm of tonsillar fossa: Secondary | ICD-10-CM | POA: Diagnosis not present

## 2019-06-28 DIAGNOSIS — Z85818 Personal history of malignant neoplasm of other sites of lip, oral cavity, and pharynx: Secondary | ICD-10-CM | POA: Diagnosis not present

## 2019-06-29 MED ORDER — ATORVASTATIN CALCIUM 40 MG PO TABS: 80.0000 mg | ORAL_TABLET | Freq: Every day | ORAL | 2 refills | Status: DC

## 2019-06-29 NOTE — Addendum Note (Signed)
Addended by: Truddie Hidden on: 06/29/2019 05:14 PM   Modules accepted: Orders

## 2019-06-30 ENCOUNTER — Encounter: Payer: Self-pay | Admitting: Cardiology

## 2019-06-30 ENCOUNTER — Telehealth: Payer: Self-pay | Admitting: Cardiology

## 2019-06-30 DIAGNOSIS — I714 Abdominal aortic aneurysm, without rupture: Secondary | ICD-10-CM | POA: Diagnosis not present

## 2019-06-30 NOTE — Telephone Encounter (Signed)
Anderson Malta from [facility] calling for an order for the AAA Duplex. She states the patient is scheduled today at 1:00pm. Transferred call to the nurse.

## 2019-06-30 NOTE — Telephone Encounter (Signed)
Order faxed.

## 2019-06-30 NOTE — Telephone Encounter (Signed)
Cody Benjamin from [facility] needs the order for the AAA Duplex faxed to [phone].

## 2019-07-19 ENCOUNTER — Other Ambulatory Visit: Payer: Self-pay

## 2019-07-19 ENCOUNTER — Ambulatory Visit (INDEPENDENT_AMBULATORY_CARE_PROVIDER_SITE_OTHER): Payer: PPO

## 2019-07-19 DIAGNOSIS — I714 Abdominal aortic aneurysm, without rupture: Secondary | ICD-10-CM | POA: Diagnosis not present

## 2019-07-19 NOTE — Progress Notes (Addendum)
Complete aortic duplex exam has been performed. Jimmy Hayla Hinger RDCS, RVT

## 2019-07-24 ENCOUNTER — Telehealth: Payer: Self-pay

## 2019-07-24 NOTE — Telephone Encounter (Signed)
Pt aware of result of CT done at [facility].

## 2019-07-31 DIAGNOSIS — J3089 Other allergic rhinitis: Secondary | ICD-10-CM | POA: Diagnosis not present

## 2019-07-31 DIAGNOSIS — Z85818 Personal history of malignant neoplasm of other sites of lip, oral cavity, and pharynx: Secondary | ICD-10-CM | POA: Diagnosis not present

## 2019-07-31 DIAGNOSIS — Z9221 Personal history of antineoplastic chemotherapy: Secondary | ICD-10-CM | POA: Diagnosis not present

## 2019-07-31 DIAGNOSIS — Z923 Personal history of irradiation: Secondary | ICD-10-CM | POA: Diagnosis not present

## 2019-07-31 DIAGNOSIS — H9190 Unspecified hearing loss, unspecified ear: Secondary | ICD-10-CM | POA: Diagnosis not present

## 2019-07-31 DIAGNOSIS — J342 Deviated nasal septum: Secondary | ICD-10-CM | POA: Diagnosis not present

## 2019-08-11 DIAGNOSIS — E782 Mixed hyperlipidemia: Secondary | ICD-10-CM | POA: Diagnosis not present

## 2019-08-11 DIAGNOSIS — Z79899 Other long term (current) drug therapy: Secondary | ICD-10-CM | POA: Diagnosis not present

## 2019-08-11 LAB — HEPATIC FUNCTION PANEL
ALT: 15 IU/L (ref 0–44)
AST: 20 IU/L (ref 0–40)
Albumin: 4.4 g/dL (ref 3.7–4.7)
Alkaline Phosphatase: 129 IU/L — ABNORMAL HIGH (ref 48–121)
Bilirubin Total: 0.4 mg/dL (ref 0.0–1.2)
Bilirubin, Direct: 0.12 mg/dL (ref 0.00–0.40)
Total Protein: 6.7 g/dL (ref 6.0–8.5)

## 2019-08-11 LAB — LIPID PANEL
Chol/HDL Ratio: 4.6 ratio (ref 0.0–5.0)
Cholesterol, Total: 148 mg/dL (ref 100–199)
HDL: 32 mg/dL — ABNORMAL LOW (ref >39)
LDL Chol Calc (NIH): 95 mg/dL (ref 0–99)
Triglycerides: 114 mg/dL (ref 0–149)
VLDL Cholesterol Cal: 21 mg/dL (ref 5–40)

## 2019-08-14 ENCOUNTER — Other Ambulatory Visit: Payer: Self-pay

## 2019-08-14 MED ORDER — ATORVASTATIN CALCIUM 80 MG PO TABS: 80.0000 mg | ORAL_TABLET | Freq: Every day | ORAL | 3 refills | Status: DC

## 2019-08-16 ENCOUNTER — Telehealth: Payer: Self-pay | Admitting: Cardiology

## 2019-08-16 NOTE — Telephone Encounter (Signed)
Medication dose confirmed with Becky and pt as well.

## 2019-08-16 NOTE — Telephone Encounter (Signed)
New Message  Pt c/o medication issue:  1. Name of Medication: atorvastatin (LIPITOR) 80 MG tablet  2. How are you currently taking this medication (dosage and times per day)? As directed  3. Are you having a reaction (difficulty breathing--STAT)? No  4. What is your medication issue? Patient's wife is calling in to confirm whether or now that patient should be taking 80 mg of this medication. Please call and confirm.

## 2019-09-25 ENCOUNTER — Other Ambulatory Visit: Payer: Self-pay | Admitting: Cardiology

## 2019-10-26 IMAGING — CR DG CHEST 1V PORT
1 series · 1 of 1 positions shown · non-contrast
Comparison: Chest radiographs and CT 12/05/2017.

CLINICAL DATA: Postop abdominal aortic aneurysm repair.

EXAM:
PORTABLE CHEST 1 VIEW

[AP]
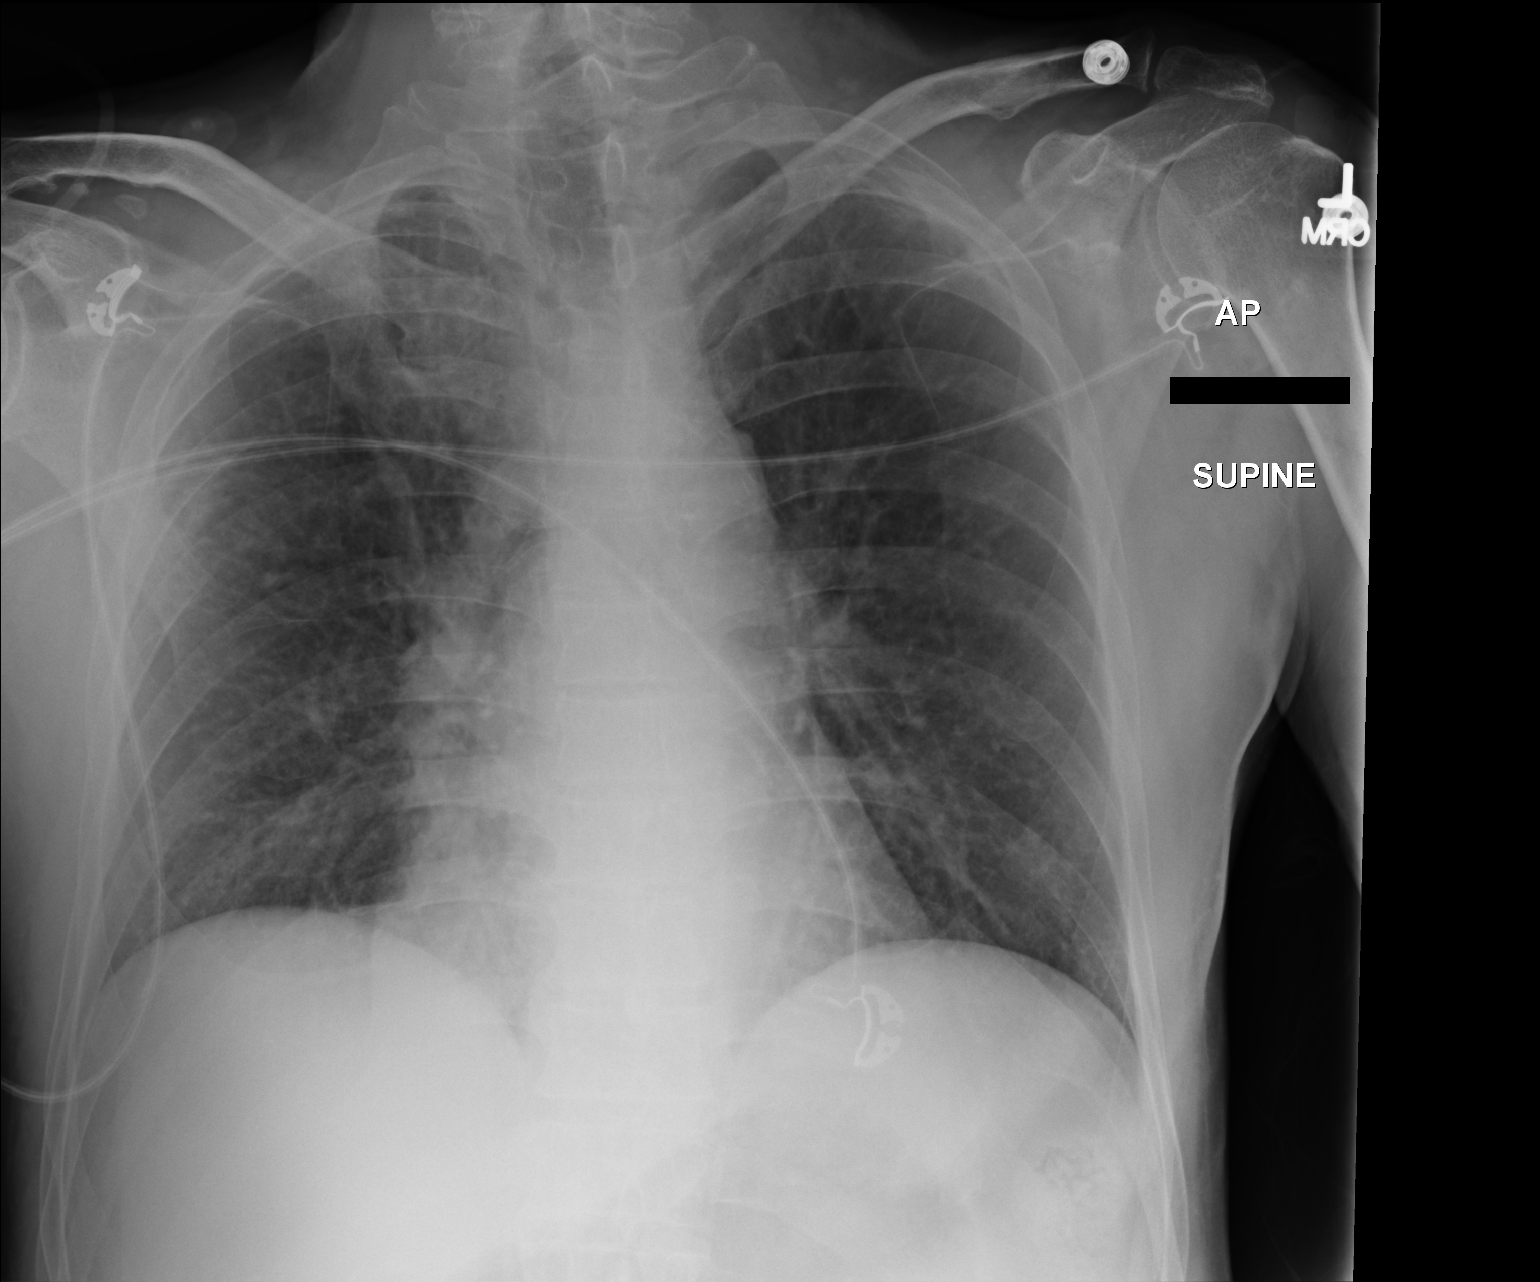

[1 of 1 positions shown; findings below may reference images not displayed]

FINDINGS: 3315 hours. Patient is mildly rotated to the right. The heart size
and mediastinal contours are stable. There has been interval partial
clearing of the previously demonstrated peripheral right upper lobe
airspace disease. No progressive airspace disease, edema, pleural
effusion or pneumothorax identified. Telemetry leads overlie the
chest.
IMPRESSION: Interval partial clearing of peripheral right upper lobe airspace
disease consistent with resolving pneumonia. Two view examination
within the next month recommended to document complete clearing. No
acute findings identified.

## 2019-10-27 DIAGNOSIS — Z79899 Other long term (current) drug therapy: Secondary | ICD-10-CM | POA: Diagnosis not present

## 2019-10-27 DIAGNOSIS — Z85818 Personal history of malignant neoplasm of other sites of lip, oral cavity, and pharynx: Secondary | ICD-10-CM | POA: Diagnosis not present

## 2019-10-27 DIAGNOSIS — C090 Malignant neoplasm of tonsillar fossa: Secondary | ICD-10-CM | POA: Diagnosis not present

## 2019-10-30 DIAGNOSIS — J342 Deviated nasal septum: Secondary | ICD-10-CM | POA: Diagnosis not present

## 2019-10-30 DIAGNOSIS — Z923 Personal history of irradiation: Secondary | ICD-10-CM | POA: Diagnosis not present

## 2019-10-30 DIAGNOSIS — Y842 Radiological procedure and radiotherapy as the cause of abnormal reaction of the patient, or of later complication, without mention of misadventure at the time of the procedure: Secondary | ICD-10-CM | POA: Diagnosis not present

## 2019-10-30 DIAGNOSIS — Z9221 Personal history of antineoplastic chemotherapy: Secondary | ICD-10-CM | POA: Diagnosis not present

## 2019-10-30 DIAGNOSIS — Z85818 Personal history of malignant neoplasm of other sites of lip, oral cavity, and pharynx: Secondary | ICD-10-CM | POA: Diagnosis not present

## 2019-10-30 DIAGNOSIS — K117 Disturbances of salivary secretion: Secondary | ICD-10-CM | POA: Diagnosis not present

## 2019-11-20 IMAGING — CT CT CTA ABD/PEL W/CM AND/OR W/O CM
2 of 10 series · 10 of 46 positions shown, 11 images · IV contrast (iopamidol)
Comparison: CT abdomen pelvis-12/16/2017

CLINICAL DATA: Post endovascular repair of infrarenal abdominal
aortic aneurysm.

EXAM:
CTA ABDOMEN AND PELVIS WITH CONTRAST
TECHNIQUE: Multidetector CT imaging of the abdomen and pelvis was performed
using the standard protocol during bolus administration of
intravenous contrast. Multiplanar reconstructed images and MIPs were
obtained and reviewed to evaluate the vascular anatomy.
CONTRAST:  75mL HXS7BS-FUL IOPAMIDOL (HXS7BS-FUL) INJECTION 76%

[Series 8: cta arterial 2.00 bv36 s3 ax cor art st · axial · arterial · 0.49mm/px · z∈[+1145,+1469]mm · 8 of 210 slices shown, 9 images]
[im 24/210  soft-tissue]
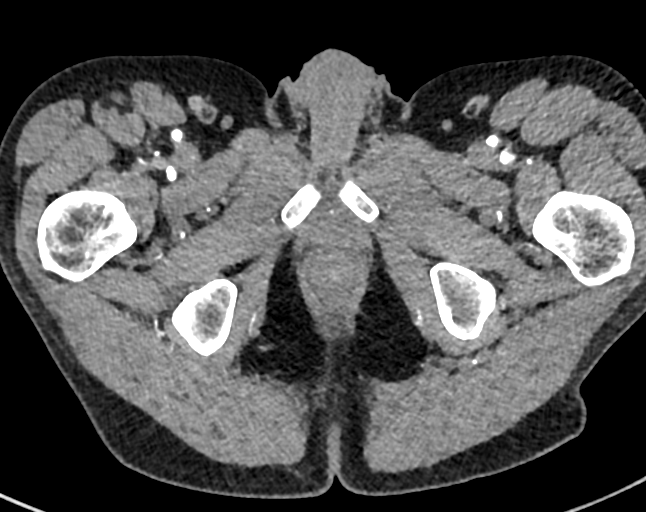
[im 24/210  bone]
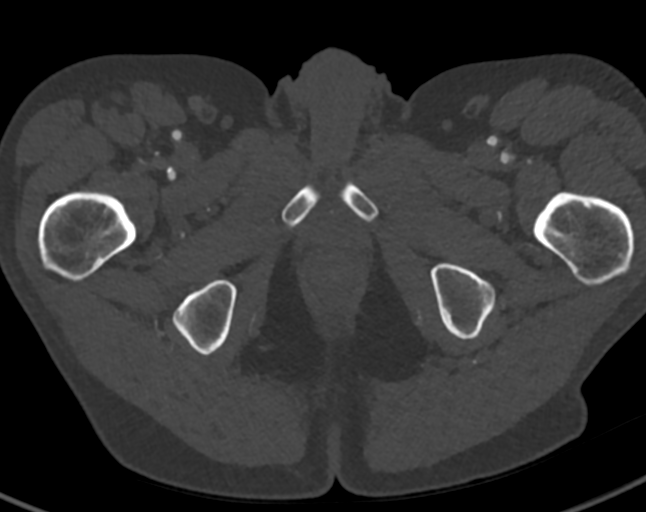
[im 47/210  soft-tissue]
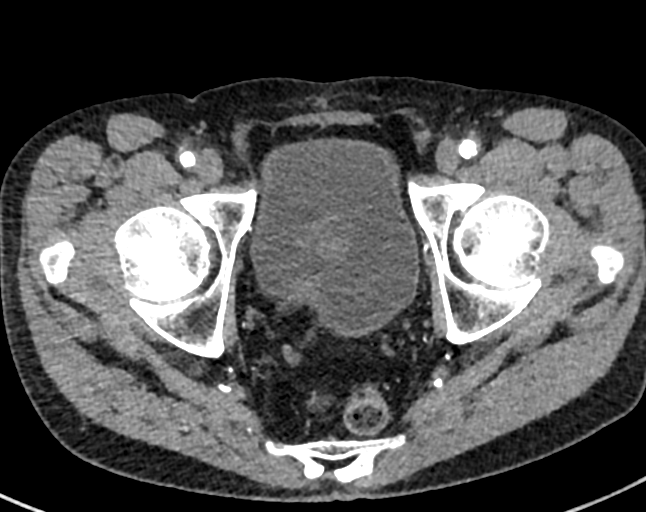
[im 70/210  soft-tissue]
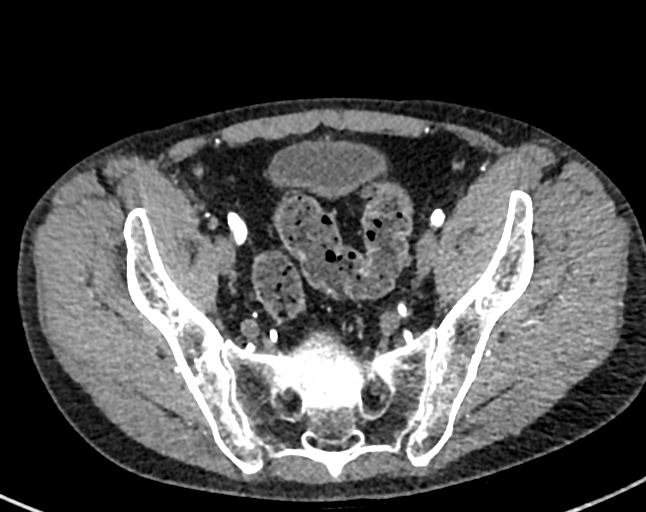
[im 93/210  soft-tissue]
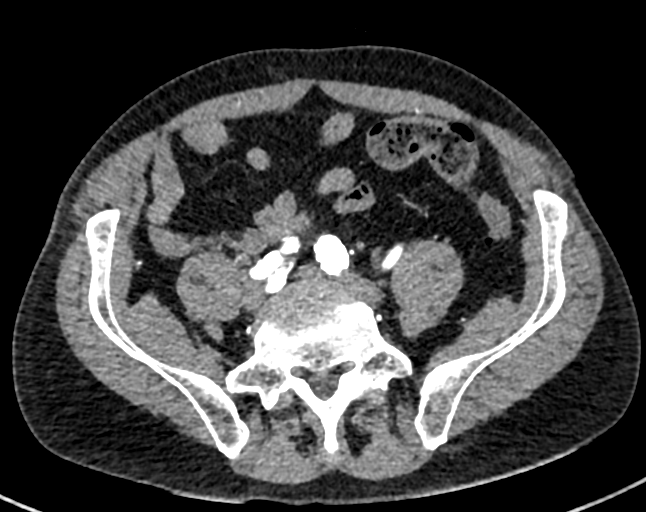
[im 117/210  soft-tissue]
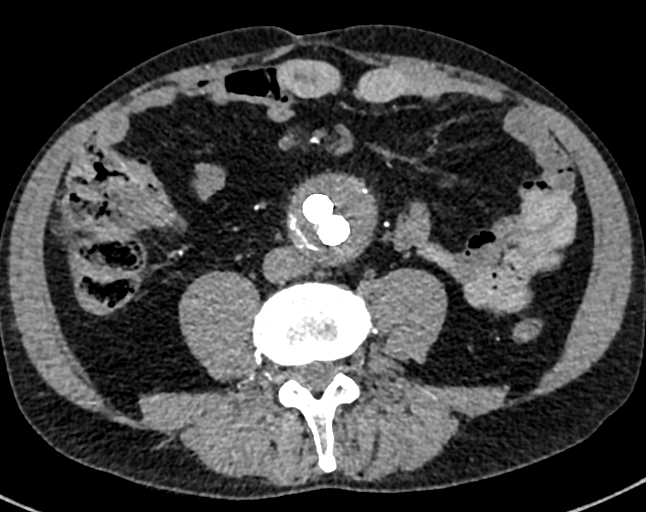
[im 140/210  soft-tissue]
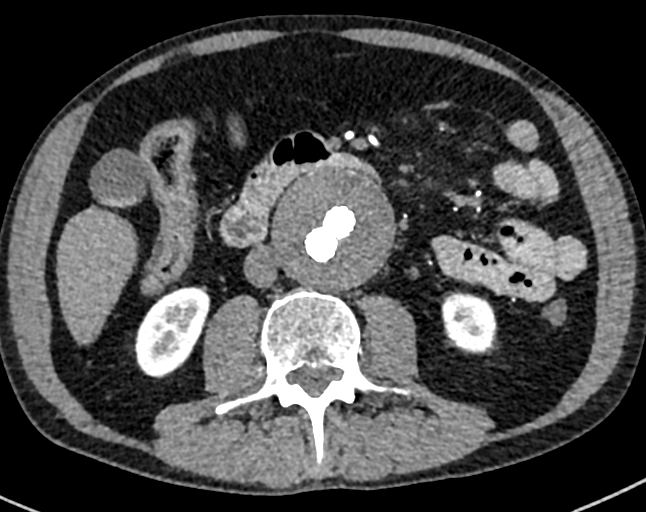
[im 163/210  soft-tissue]
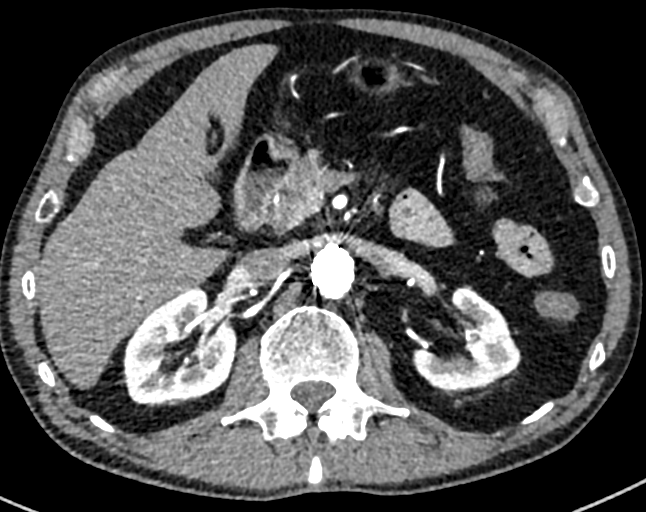
[im 186/210  soft-tissue]
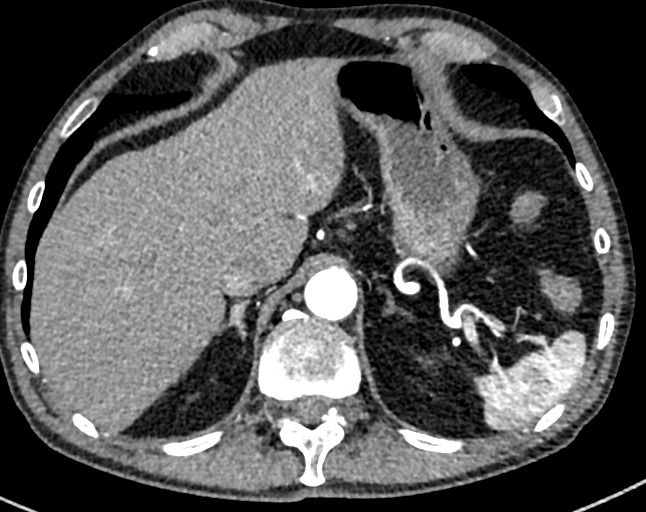

[Series 10: cta arterial 2.00 bv36 s3 cor cor art st · coronal · arterial · 0.62mm/px · 2 of 125 slices shown]
[im 42/125  soft-tissue]
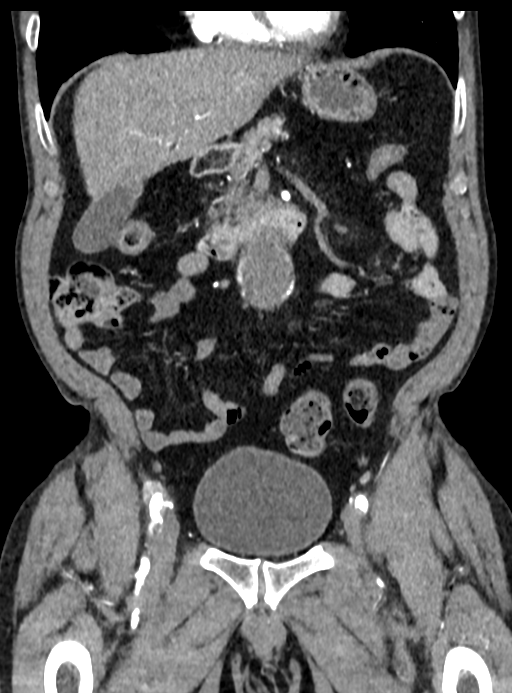
[im 83/125  soft-tissue]
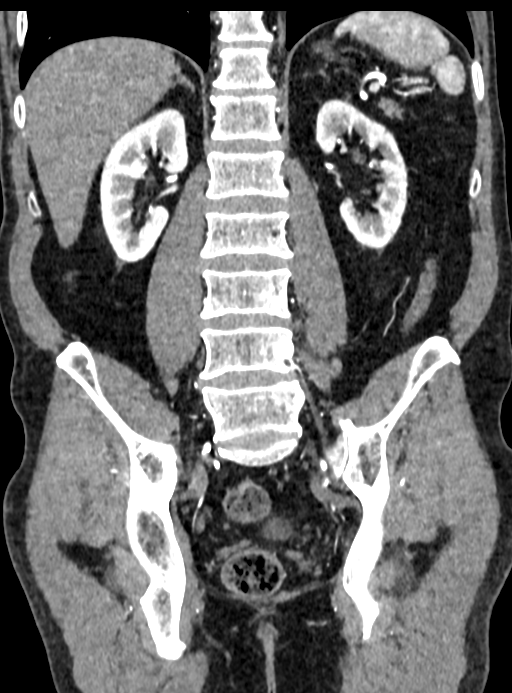

[10 of 46 positions shown; findings below may reference images not displayed]

FINDINGS: VASCULAR

Aorta: Post endovascular repair of infrarenal abdominal aortic
aneurysm. The stent graft is well apposed against the walls of the
infrarenal abdominal aorta as well as the walls of the bilateral
common iliac arteries. The stent graft appears widely patent without
mural thrombosis.

The native abdominal aortic aneurysm sac is grossly unchanged in
size measuring approximate 6.2 x 6.4 x 6.3 cm as measured in
greatest oblique short axis axial (image 80, series 8), coronal
(image 55, series 10) and sagittal (image 81, series 12) dimensions
respectively previously, 6.3 x 6.2 x 6.4 cm.

There is a minimal amount of calcified mural thrombus within the
native abdominal aortic aneurysm sac (representative image 33,
series 3). There is no definitive opacification of the native
abdominal aortic aneurysm sac to suggest the presence of an
endoleak. No definitive periaortic stranding.

Celiac: There is a minimal amount of eccentric hypoechoic plaque
involving the origin of the celiac artery, not resulting in
hemodynamically significant stenosis. The celiac artery is noted to
demonstrate non conventional anatomy with lack of a proper hepatic
trunk. The left gastric artery is noted to give rise to a accessory
left hepatic artery.

SMA: Widely patent without hemodynamically significant stenosis. A
proper hepatic artery arises from the SMA and gives rise to the
right hepatic as well as the medial segmental division of the left
hepatic arteries.

Renals: Solitary bilaterally with note again made of a early
trifurcation of the right renal artery. There is very mild beaded
irregularity of the left renal artery (representative coronal image
68, series 10 as could be seen in the setting of FMD.

IMA: Occluded at its origin with early reconstitution via collateral
supply from the SMA.

Inflow: As above, the distal limbs of the stent graft are well
apposed against the walls of the bilateral common iliac arteries.
There is a moderate amount of irregular mixed calcified and
noncalcified atherosclerotic plaque within the tortuous bilateral
common iliac arteries not definitely resulting in hemodynamically
significant stenosis.

The bilateral internal iliac arteries are diseased though patent and
of normal caliber.

Proximal Outflow: There is a tiny (approximately 1.0 x 0.7 cm)
likely access site related pseudoaneurysm of the left common femoral
artery which demonstrates a minimal amount of mural thrombus though
with persistent partial opacification (representative coronal image
35, series 10; axial image 65, series 8). The opacified portion of
the tiny pseudoaneurysm measures approximately 0.5 x 0.5 cm with a
neck of approximately 4 mm (axial image 65, series 8).

High-density material is noted adjacent to the presumed right common
femoral arterial access site without definitive contrast
extravasation (representative coronal image 40, series 10, axial
image 156, series 8), nonspecific though potentially representative
of a small evolving hematoma. Note, this area was not imaged on the
precontrast or delayed phase series.

Veins: IVC and pelvic venous system appear widely

Review of the MIP images confirms the above findings.

_________________________________________________________

NON-VASCULAR

Evaluation the abdominal organs is limited to the arterial phase of
enhancement.

Lower chest: Limited visualization of the lower thorax demonstrates
advanced centrilobular emphysematous change. Minimal dependent
subpleural ground-glass atelectasis within the imaged left lower
lobe. No discrete focal airspace opacities. No pleural effusion.

Hepatobiliary: Normal hepatic contour. There is an ill-defined
approximately 1.3 by 0.9 cm hyperenhancing lesion within the dome of
the lateral segment of left lobe of the liver (image 23, series 8)
grossly unchanged compared to the [DATE] examination and is again
noted to demonstrate peripheral interrupted nodular enhancement
compatible with a hepatic hemangioma. No new worrisome hepatic
lesions.

Layering radiopaque debris within otherwise normal-appearing
gallbladder. No gallbladder wall thickening or pericholecystic
fluid. No intra or extrahepatic biliary duct dilatation. No ascites.

Pancreas: Normal appearance of the pancreas

Spleen: Normal appearance of the spleen

Adrenals/Urinary Tract: There is symmetric enhancement and excretion
of the bilateral kidneys. No renal stones. No discrete renal
lesions. There is a very minimal amount of grossly symmetric likely
age and body habitus related perinephric stranding. No urine
obstruction.

Normal appearance of the bilateral adrenal glands.

Normal appearance of the urinary bladder.

Stomach/Bowel: Moderate colonic stool burden without evidence of
enteric obstruction. Normal appearance of the terminal ileum and
retrocecal appendix. No discrete areas of bowel wall thickening. No
pneumoperitoneum, pneumatosis or portal venous gas.

Lymphatic: Questioned prominent mesenteric appears less prominent
the present examination measuring approximately 0.6 cm in greatest
short axis diameter (image 97, series 8). No bulky retroperitoneal,
mesenteric, pelvic or inguinal lymphadenopathy.

Reproductive: Dystrophic calcifications within a borderline enlarged
prostate gland. No free fluid in the pelvic cul-de-sac.

Other: There is a minimal amount of ill-defined stranding about the
groins bilaterally. No definable/drainable fluid collection.

Musculoskeletal: No acute or aggressive osseous abnormalities.
IMPRESSION: VASCULAR

1. Post endovascular repair of infrarenal abdominal aortic aneurysm
without evidence of an endoleak. The native abdominal aortic
aneurysm is unchanged in size measuring 6.4 cm in maximal diameter.
Aortic aneurysm NOS (7TJAP-AAI.R).
2. Approximately 1 cm partially thrombosed presumed access site
related pseudoaneurysm of left common femoral artery.
3. High-density material adjacent to the right common femoral
arterial access site, indeterminate though potentially
representative of an evolving hematoma. No definitive contrast
extravasation.
4. Mild beaded irregularity of the left renal artery without
evidence of a hemodynamically significant stenosis, nonspecific
though could be seen in the setting mild FMD.
5.  Aortic Atherosclerosis (7TJAP-MZP.P).

NON-VASCULAR

1. Advanced emphysematous change within the imaged lung bases.
Emphysema (7TJAP-5WN.U).
2. Cholelithiasis/biliary sludge without evidence of cholecystitis.
3. Previously questioned prominent mesenteric lymph node appears
less conspicuous on the present examination and thus was presumably
reactive in etiology.

## 2019-11-23 DIAGNOSIS — Z1152 Encounter for screening for COVID-19: Secondary | ICD-10-CM | POA: Diagnosis not present

## 2019-11-23 DIAGNOSIS — Z20822 Contact with and (suspected) exposure to covid-19: Secondary | ICD-10-CM | POA: Diagnosis not present

## 2020-01-03 DIAGNOSIS — N400 Enlarged prostate without lower urinary tract symptoms: Secondary | ICD-10-CM | POA: Insufficient documentation

## 2020-01-03 DIAGNOSIS — Z87442 Personal history of urinary calculi: Secondary | ICD-10-CM | POA: Insufficient documentation

## 2020-01-03 DIAGNOSIS — J189 Pneumonia, unspecified organism: Secondary | ICD-10-CM | POA: Insufficient documentation

## 2020-01-03 DIAGNOSIS — E7889 Other lipoprotein metabolism disorders: Secondary | ICD-10-CM | POA: Insufficient documentation

## 2020-01-03 DIAGNOSIS — N189 Chronic kidney disease, unspecified: Secondary | ICD-10-CM | POA: Insufficient documentation

## 2020-01-03 DIAGNOSIS — M1990 Unspecified osteoarthritis, unspecified site: Secondary | ICD-10-CM | POA: Insufficient documentation

## 2020-01-03 DIAGNOSIS — R509 Fever, unspecified: Secondary | ICD-10-CM | POA: Insufficient documentation

## 2020-01-03 DIAGNOSIS — Z79899 Other long term (current) drug therapy: Secondary | ICD-10-CM | POA: Insufficient documentation

## 2020-01-03 DIAGNOSIS — R739 Hyperglycemia, unspecified: Secondary | ICD-10-CM | POA: Insufficient documentation

## 2020-01-05 ENCOUNTER — Other Ambulatory Visit: Payer: Self-pay

## 2020-01-05 ENCOUNTER — Ambulatory Visit: Payer: PPO | Admitting: Cardiology

## 2020-01-05 ENCOUNTER — Encounter: Payer: Self-pay | Admitting: Cardiology

## 2020-01-05 VITALS — BP 106/72 | HR 98 | Ht 68.0 in | Wt 157.8 lb

## 2020-01-05 DIAGNOSIS — Z87891 Personal history of nicotine dependence: Secondary | ICD-10-CM

## 2020-01-05 DIAGNOSIS — Z1329 Encounter for screening for other suspected endocrine disorder: Secondary | ICD-10-CM

## 2020-01-05 DIAGNOSIS — E782 Mixed hyperlipidemia: Secondary | ICD-10-CM

## 2020-01-05 DIAGNOSIS — I714 Abdominal aortic aneurysm, without rupture, unspecified: Secondary | ICD-10-CM

## 2020-01-05 LAB — TSH: TSH: 4.07 u[IU]/mL (ref 0.450–4.500)

## 2020-01-05 LAB — BASIC METABOLIC PANEL
BUN/Creatinine Ratio: 14 (ref 10–24)
BUN: 17 mg/dL (ref 8–27)
CO2: 26 mmol/L (ref 20–29)
Calcium: 9.7 mg/dL (ref 8.6–10.2)
Chloride: 101 mmol/L (ref 96–106)
Creatinine, Ser: 1.24 mg/dL (ref 0.76–1.27)
GFR calc Af Amer: 67 mL/min/{1.73_m2} (ref >59)
GFR calc non Af Amer: 58 mL/min/{1.73_m2} — ABNORMAL LOW (ref >59)
Glucose: 121 mg/dL — ABNORMAL HIGH (ref 65–99)
Potassium: 4.3 mmol/L (ref 3.5–5.2)
Sodium: 138 mmol/L (ref 134–144)

## 2020-01-05 LAB — HEPATIC FUNCTION PANEL
ALT: 20 IU/L (ref 0–44)
AST: 20 IU/L (ref 0–40)
Albumin: 4.3 g/dL (ref 3.7–4.7)
Alkaline Phosphatase: 115 IU/L (ref 44–121)
Bilirubin Total: 0.5 mg/dL (ref 0.0–1.2)
Bilirubin, Direct: 0.15 mg/dL (ref 0.00–0.40)
Total Protein: 7 g/dL (ref 6.0–8.5)

## 2020-01-05 LAB — LIPID PANEL
Chol/HDL Ratio: 4.8 ratio (ref 0.0–5.0)
Cholesterol, Total: 172 mg/dL (ref 100–199)
HDL: 36 mg/dL — ABNORMAL LOW (ref >39)
LDL Chol Calc (NIH): 117 mg/dL — ABNORMAL HIGH (ref 0–99)
Triglycerides: 102 mg/dL (ref 0–149)
VLDL Cholesterol Cal: 19 mg/dL (ref 5–40)

## 2020-01-05 NOTE — Progress Notes (Signed)
Cardiology Office Note:    Date:  01/05/2020   ID:  Cody Benjamin, DOB 1945/07/18, MRN [id number]  PCP:  Ocie Doyne., MD  Cardiologist:  Jenean Lindau, MD   Referring MD: No ref. provider found    ASSESSMENT:    1. Abdominal aortic aneurysm (AAA) without rupture (Cody Benjamin)   2. Ex-cigarette smoker   3. Mixed dyslipidemia    PLAN:    In order of problems listed above:  1. Secondary prevention stressed with the patient.  Importance of compliance with diet medication stressed and he vocalized understanding.  Advised him to walk at least half an hour a day on a regular basis.  He promises to do so. 2. Abdominal aortic aneurysm: Post repair: Stable.  Last evaluation was reviewed with him.  Including CT scan report.  Since its been sometime since he had intervention I would like him to do a one-time evaluation by vascular surgery and we will set him up for this.  I will try the prefer that they do the testing for follow-up at this time.  I discussed this with him and he is agreeable. 3. Ex cigarette smoker: Promises never to smoke again. 4. Mixed dyslipidemia: Diet was emphasized.  Last lipids were reviewed and he will have blood work including fasting lipids as he is fasting. 5. Patient will be seen in follow-up appointment in 6 months or earlier if the patient has any concerns    Medication Adjustments/Labs and Tests Ordered: Current medicines are reviewed at length with the patient today.  Concerns regarding medicines are outlined above.  No orders of the defined types were placed in this encounter.  No orders of the defined types were placed in this encounter.    No chief complaint on file.    History of Present Illness:    Cody Benjamin is a 74 y.o. male.  Patient has past medical history of abdominal aortic aneurysm and tonsil cancer.  He denies any problems at this time and takes care of activities of daily living.  No chest pain orthopnea or PND.  He leads a sedentary  lifestyle.  He does not smoke.  He does not have essential hypertension.  He is on statin therapy for mixed dyslipidemia and aneurysm.  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 (Cody Benjamin) 05/30/2018   Mixed dyslipidemia 12/08/2018   Pneumonia    Postoperative examination 06/13/2018   Tonsil cancer (Cody Benjamin) 05/30/2018    Past Surgical History:  Procedure Laterality Date   ABDOMINAL AORTIC ENDOVASCULAR STENT GRAFT N/A 12/27/2017   Procedure: ABDOMINAL AORTIC ENDOVASCULAR STENT GRAFT;  Surgeon: Elam Dutch, MD;  Location: [facility] OR;  Service: Vascular;  Laterality: N/A;   COLONOSCOPY     SHOULDER SURGERY Right 1969    Current Medications: Current Meds  Medication Sig   acetaminophen (TYLENOL) 500 MG tablet Take 1,000 mg by mouth every 6 (  six) hours as needed for moderate pain or headache.   aspirin EC 81 MG tablet Take 81 mg by mouth daily.     Allergies:   Levofloxacin   Social History   Socioeconomic History   Marital status: Married    Spouse name: Not on file   Number of children: Not on file   Years of education: Not on file   Highest education level: Not on file  Occupational History   Not on file  Tobacco Use   Smoking status: Former Smoker    Quit date: 03/09/2013    Years since quitting: 6.8   Smokeless tobacco: Never Used  Vaping Use   Vaping Use: Every day  Substance and Sexual Activity   Alcohol use: Never   Drug use: Never    Sexual activity: Not on file  Other Topics Concern   Not on file  Social History Narrative   Not on file   Social Determinants of Health   Financial Resource Strain:    Difficulty of Paying Living Expenses: Not on file  Food Insecurity:    Worried About [place] in the Last Year: Not on file   [place] in the Last Year: Not on file  Transportation Needs:    Lack of Transportation (Medical): Not on file   Lack of Transportation (Non-Medical): Not on file  Physical Activity:    Days of Exercise per Week: Not on file   Minutes of Exercise per Session: Not on file  Stress:    Feeling of Stress : Not on file  Social Connections:    Frequency of Communication with Friends and Family: Not on file   Frequency of Social Gatherings with Friends and Family: Not on file   Attends Religious Services: Not on file   Active Member of Clubs or Organizations: Not on file   Attends Archivist Meetings: Not on file   Marital Status: Not on file     Family History: The patient's family history includes Filiberto cancer in his mother; Seizures in his brother.  ROS:   Please see the history of present illness.    All other systems reviewed and are negative.  EKGs/Labs/Other Studies Reviewed:    The following studies were reviewed today: I discussed my findings with the patient at length.  EKG reveals sinus rhythm and nonspecific ST-T changes.   Recent Labs: 06/26/2019: BUN 11; Creatinine, Ser 1.12; Hemoglobin 15.1; Platelets 219; Potassium 4.4; Sodium 141; TSH 5.100 08/11/2019: ALT 15  Recent Lipid Panel    Component Value Date/Time   CHOL 148 08/11/2019 0803   TRIG 114 08/11/2019 0803   HDL 32 (L) 08/11/2019 0803   CHOLHDL 4.6 08/11/2019 0803   LDLCALC 95 08/11/2019 0803    Physical Exam:    VS:  BP 106/72    Pulse 98    Ht 5\' 8"  (1.727 m)    Wt 157 lb 12.8 oz (71.6 kg)    SpO2 97%    BMI 23.99 kg/m     Wt Readings from Last 3  Encounters:  01/05/20 157 lb 12.8 oz (71.6 kg)  06/26/19 148 lb (67.1 kg)  12/08/18 143 lb (64.9 kg)     GEN: Patient is in no acute distress HEENT: Normal NECK: No JVD; No carotid bruits LYMPHATICS: No lymphadenopathy CARDIAC: Hear sounds regular, 2/6 systolic murmur at the apex. RESPIRATORY:  Clear to auscultation without rales, wheezing or rhonchi  ABDOMEN: Soft, non-tender, non-distended MUSCULOSKELETAL:  No edema; No deformity  SKIN: Warm and dry NEUROLOGIC:  Alert and oriented x 3 PSYCHIATRIC:  Normal affect   Signed, Jenean Lindau, MD  01/05/2020 8:49 AM    [facility] Benjamin

## 2020-01-05 NOTE — Patient Instructions (Addendum)

## 2020-02-17 DIAGNOSIS — Z20822 Contact with and (suspected) exposure to covid-19: Secondary | ICD-10-CM | POA: Diagnosis not present

## 2020-02-17 DIAGNOSIS — J019 Acute sinusitis, unspecified: Secondary | ICD-10-CM | POA: Diagnosis not present

## 2020-02-17 DIAGNOSIS — B9689 Other specified bacterial agents as the cause of diseases classified elsewhere: Secondary | ICD-10-CM | POA: Diagnosis not present

## 2020-02-22 ENCOUNTER — Other Ambulatory Visit: Payer: Self-pay | Admitting: *Deleted

## 2020-02-22 DIAGNOSIS — I714 Abdominal aortic aneurysm, without rupture, unspecified: Secondary | ICD-10-CM

## 2020-02-26 DIAGNOSIS — Z923 Personal history of irradiation: Secondary | ICD-10-CM | POA: Diagnosis not present

## 2020-02-26 DIAGNOSIS — Z9221 Personal history of antineoplastic chemotherapy: Secondary | ICD-10-CM | POA: Diagnosis not present

## 2020-02-26 DIAGNOSIS — Z85819 Personal history of malignant neoplasm of unspecified site of lip, oral cavity, and pharynx: Secondary | ICD-10-CM | POA: Diagnosis not present

## 2020-02-26 DIAGNOSIS — R432 Parageusia: Secondary | ICD-10-CM | POA: Diagnosis not present

## 2020-02-26 DIAGNOSIS — Z87898 Personal history of other specified conditions: Secondary | ICD-10-CM | POA: Diagnosis not present

## 2020-02-26 DIAGNOSIS — J342 Deviated nasal septum: Secondary | ICD-10-CM | POA: Diagnosis not present

## 2020-03-07 ENCOUNTER — Ambulatory Visit (HOSPITAL_COMMUNITY)
Admission: RE | Admit: 2020-03-07 | Discharge: 2020-03-07 | Disposition: A | Discharge status: Home / Self Care | Payer: PPO | Source: Ambulatory Visit | Attending: Vascular Surgery | Admitting: Vascular Surgery

## 2020-03-07 ENCOUNTER — Encounter: Payer: Self-pay | Admitting: Vascular Surgery

## 2020-03-07 ENCOUNTER — Other Ambulatory Visit: Payer: Self-pay

## 2020-03-07 ENCOUNTER — Ambulatory Visit: Payer: PPO | Admitting: Vascular Surgery

## 2020-03-07 VITALS — BP 129/79 | HR 99 | Resp 18 | Ht 68.5 in | Wt 155.0 lb

## 2020-03-07 DIAGNOSIS — I714 Abdominal aortic aneurysm, without rupture, unspecified: Secondary | ICD-10-CM

## 2020-03-07 NOTE — Progress Notes (Signed)
Patient is a 74 year old male who returns for follow-up today.  He previously had endovascular stent graft repair of an infrarenal abdominal aortic aneurysm in 2019.  His aneurysm preoperatively was 6.5 cm.  Subsequently he had an ultrasound in May 2021 which had to discrepant findings.  They were done at 2 different sites in [place].  One was 3.2 cm.  The other was 5.4 cm.  Patient has no abdominal pain.  He did have cancer treatment for a tonsil cancer with radiation and chemotherapy in the past year but is recovering from this.  He is on aspirin and a statin.  Review of systems: He has returned to normal activities and his exercise tolerance is increasing.  He is playing golf more actively.  He does not have shortness of breath or chest pain.  He still does have some xerostomia from his radiation therapy.    Current Outpatient Medications on File Prior to Visit  Medication Sig Dispense Refill   acetaminophen (TYLENOL) 500 MG tablet Take 1,000 mg by mouth every 6 (six) hours as needed for moderate pain or headache.     aspirin EC 81 MG tablet Take 81 mg by mouth daily.     atorvastatin (LIPITOR) 80 MG tablet Take 1 tablet (80 mg total) by mouth daily. 90 tablet 3   No current facility-administered medications on file prior to visit.   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 ([place]) 05/30/2018   Mixed dyslipidemia 12/08/2018   Pneumonia    Postoperative  examination 06/13/2018   Tonsil cancer ([place]) 05/30/2018   Past Surgical History:  Procedure Laterality Date   ABDOMINAL AORTIC ENDOVASCULAR STENT GRAFT N/A 12/27/2017   Procedure: ABDOMINAL AORTIC ENDOVASCULAR STENT GRAFT;  Surgeon: Elam Dutch, MD;  Location: [place];  Service: Vascular;  Laterality: N/A;   COLONOSCOPY     SHOULDER SURGERY Right 1969    Physical exam:  Vitals:   03/07/20 0943  BP: 129/79  Pulse: 99  Resp: 18  SpO2: 97%  Weight: 155 lb (70.3 kg)  Height: 5' 8.5" (1.74 m)    Abdomen: Soft nontender nondistended no palpable pulsatile mass   Extremities: 2+ femoral pulses 2+ dorsalis pedis pulses  Data: Patient had duplex ultrasound of his abdominal aorta today.  This showed an aortic diameter of 5.3 cm no evidence of endoleak this is essentially unchanged from May 2021 from a [facility] radiology imaging study where his diameter was 5.4 cm.  I suspect that the 3.2 cm study was discrepant.  This was on May 12 of 2021.  He has definitely shrunk down from his 6.5 cm diameter preoperatively.  Assessment: Doing well status post aortic aneurysm stent graft repair of infrarenal abdominal aortic aneurysm.  Current diameter 5.3 cm.  No evidence of endoleak.  Plan: Patient will follow up in our APP clinic with aortic ultrasound in 1 year. Ruta Hinds, MD [facility] Office: [phone]

## 2020-04-09 ENCOUNTER — Other Ambulatory Visit: Payer: Self-pay | Admitting: Oncology

## 2020-04-09 DIAGNOSIS — C099 Malignant neoplasm of tonsil, unspecified: Secondary | ICD-10-CM

## 2020-04-09 NOTE — Progress Notes (Signed)
[facility]  [address] [place],  [place]  [address] [phone]  Clinic Day:  04/10/2020  Referring physician:  Collier Salina, NP   HISTORY OF PRESENT ILLNESS:  The patient is a 75 y.o. 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  His chemotherapy consisted of 3 cycles of cisplatin.  The patient comes in today for routine follow-up.  Since his last visit, the patient has been doing well.  As it pertains to his cancer, he denies having any dysphagia, hoarseness, or other upper aerodigestive symptoms which concern him for disease recurrence.    PHYSICAL EXAM:  Blood pressure (!) 144/75, pulse (!) 121, temperature 98 F (36.7 C), resp. rate 18, height 5' 8.5" (1.74 m), weight 162 lb (73.5 kg), SpO2 96 %. Wt Readings from Last 3 Encounters:  04/10/20 162 lb (73.5 kg)  03/07/20 155 lb (70.3 kg)  01/05/20 157 lb 12.8 oz (71.6 kg)   Body mass index is 24.27 kg/m. Performance status (ECOG): 1 - Symptomatic but completely ambulatory Physical Exam Constitutional:      Appearance: Normal appearance. He is not ill-appearing.  HENT:     Mouth/Throat:     Mouth: Mucous membranes are moist.     Pharynx: Oropharynx is clear. No oropharyngeal exudate or posterior oropharyngeal erythema.  Cardiovascular:     Rate and Rhythm: Normal rate and regular rhythm.     Heart sounds: No murmur heard. No friction rub. No gallop.   Pulmonary:     Effort: Pulmonary effort is normal. No respiratory distress.     Breath sounds: Normal breath sounds. No wheezing, rhonchi or rales.  Chest:  Breasts:     Right: No axillary adenopathy or supraclavicular adenopathy.     Left: No axillary adenopathy or supraclavicular adenopathy.    Abdominal:     General: Bowel sounds are normal. There is no distension.     Palpations: Abdomen is soft. There is no mass.     Tenderness:  There is no abdominal tenderness.  Musculoskeletal:        General: No swelling.     Right lower leg: No edema.     Left lower leg: No edema.  Lymphadenopathy:     Cervical: No cervical adenopathy.     Upper Body:     Right upper body: No supraclavicular or axillary adenopathy.     Left upper body: No supraclavicular or axillary adenopathy.     Lower Body: No right inguinal adenopathy. No left inguinal adenopathy.  Skin:    General: Skin is warm.     Coloration: Skin is not jaundiced.     Findings: No lesion or rash.  Neurological:     General: No focal deficit present.     Mental Status: He is alert and oriented to person, place, and time. Mental status is at baseline.     Cranial Nerves: Cranial nerves are intact.  Psychiatric:        Mood and Affect: Mood normal.        Behavior: Behavior normal.        Thought Content: Thought content normal.     LABS:   CBC Latest Ref Rng & Units 04/10/2020 06/26/2019 12/28/2017  WBC - 6.2 4.1 7.4  Hemoglobin 13.5 - 17.5 14.4 15.1 10.9(L)  Hematocrit 41 - 53 43 43.9 33.0(L)  Platelets 150 - 399 234 219 195  CMP Latest Ref Rng & Units 04/10/2020 01/05/2020 08/11/2019  Glucose 65 - 99 mg/dL - 121(H) -  BUN 4 - 21 15 17  -  Creatinine 0.6 - 1.3 1.1 1.24 -  Sodium 137 - 147 139 138 -  Potassium 3.4 - 5.3 3.9 4.3 -  Chloride 99 - 108 106 101 -  CO2 13 - 22 24(A) 26 -  Calcium 8.7 - 10.7 9.0 9.7 -  Total Protein 6.0 - 8.5 g/dL - 7.0 6.7  Total Bilirubin 0.0 - 1.2 mg/dL - 0.5 0.4  Alkaline Phos 25 - 125 119 115 129(H)  AST 14 - 40 24 20 20   ALT 10 - 40 18 20 15     ASSESSMENT & PLAN:  Assessment/Plan:  A 75 y.o. 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  Based upon his labs and physical exam today, the patient remains disease-free.  Clinically, the patient appears to be doing very well.  I will see him back in another 4 months for repeat clinical  assessment. The patient understands all the plans discussed today and is in agreement with them.   Batoul Limes Macarthur Critchley, MD

## 2020-04-10 ENCOUNTER — Inpatient Hospital Stay: Payer: PPO | Attending: Oncology

## 2020-04-10 ENCOUNTER — Inpatient Hospital Stay: Payer: PPO | Admitting: Oncology

## 2020-04-10 ENCOUNTER — Other Ambulatory Visit: Payer: Self-pay

## 2020-04-10 ENCOUNTER — Other Ambulatory Visit: Payer: Self-pay | Admitting: Hematology and Oncology

## 2020-04-10 ENCOUNTER — Other Ambulatory Visit: Payer: Self-pay | Admitting: Oncology

## 2020-04-10 ENCOUNTER — Telehealth: Payer: Self-pay | Admitting: Oncology

## 2020-04-10 VITALS — BP 144/75 | HR 121 | Temp 98.0°F | Resp 18 | Ht 68.5 in | Wt 162.0 lb

## 2020-04-10 DIAGNOSIS — C099 Malignant neoplasm of tonsil, unspecified: Secondary | ICD-10-CM

## 2020-04-10 DIAGNOSIS — D649 Anemia, unspecified: Secondary | ICD-10-CM | POA: Diagnosis not present

## 2020-04-10 DIAGNOSIS — Z08 Encounter for follow-up examination after completed treatment for malignant neoplasm: Secondary | ICD-10-CM | POA: Insufficient documentation

## 2020-04-10 DIAGNOSIS — Z923 Personal history of irradiation: Secondary | ICD-10-CM | POA: Diagnosis not present

## 2020-04-10 DIAGNOSIS — Z85818 Personal history of malignant neoplasm of other sites of lip, oral cavity, and pharynx: Secondary | ICD-10-CM | POA: Insufficient documentation

## 2020-04-10 DIAGNOSIS — Z9221 Personal history of antineoplastic chemotherapy: Secondary | ICD-10-CM | POA: Diagnosis not present

## 2020-04-10 LAB — BASIC METABOLIC PANEL
BUN: 15 (ref 4–21)
CO2: 24 — AB (ref 13–22)
Chloride: 106 (ref 99–108)
Creatinine: 1.1 (ref 0.6–1.3)
Glucose: 133
Potassium: 3.9 (ref 3.4–5.3)
Sodium: 139 (ref 137–147)

## 2020-04-10 LAB — CBC AND DIFFERENTIAL
HCT: 43 (ref 41–53)
Hemoglobin: 14.4 (ref 13.5–17.5)
Neutrophils Absolute: 4.09
Platelets: 234 (ref 150–399)
WBC: 6.2

## 2020-04-10 LAB — CBC: RBC: 4.8 (ref 3.87–5.11)

## 2020-04-10 LAB — HEPATIC FUNCTION PANEL
ALT: 18 (ref 10–40)
AST: 24 (ref 14–40)
Alkaline Phosphatase: 119 (ref 25–125)

## 2020-04-10 LAB — COMPREHENSIVE METABOLIC PANEL
Albumin: 4 (ref 3.5–5.0)
Calcium: 9 (ref 8.7–10.7)

## 2020-04-10 LAB — TSH: TSH: 4.02 u[IU]/mL (ref 0.350–4.500)

## 2020-04-10 NOTE — Telephone Encounter (Signed)
Per 2/2 los next appt sched and given to patient 

## 2020-04-11 ENCOUNTER — Telehealth: Payer: Self-pay | Admitting: *Deleted

## 2020-04-11 NOTE — Telephone Encounter (Signed)
Pt daughter Jacqlyn Larsen said he had the moderna vaccine and she is not sure which booster he had

## 2020-04-13 DIAGNOSIS — Z20822 Contact with and (suspected) exposure to covid-19: Secondary | ICD-10-CM | POA: Diagnosis not present

## 2020-04-13 DIAGNOSIS — B9689 Other specified bacterial agents as the cause of diseases classified elsewhere: Secondary | ICD-10-CM | POA: Diagnosis not present

## 2020-04-13 DIAGNOSIS — J019 Acute sinusitis, unspecified: Secondary | ICD-10-CM | POA: Diagnosis not present

## 2020-07-01 DIAGNOSIS — J309 Allergic rhinitis, unspecified: Secondary | ICD-10-CM | POA: Diagnosis not present

## 2020-07-01 DIAGNOSIS — M2550 Pain in unspecified joint: Secondary | ICD-10-CM | POA: Diagnosis not present

## 2020-07-01 DIAGNOSIS — K117 Disturbances of salivary secretion: Secondary | ICD-10-CM | POA: Diagnosis not present

## 2020-07-01 DIAGNOSIS — M549 Dorsalgia, unspecified: Secondary | ICD-10-CM | POA: Diagnosis not present

## 2020-07-01 DIAGNOSIS — J342 Deviated nasal septum: Secondary | ICD-10-CM | POA: Diagnosis not present

## 2020-07-01 DIAGNOSIS — J302 Other seasonal allergic rhinitis: Secondary | ICD-10-CM | POA: Diagnosis not present

## 2020-07-01 DIAGNOSIS — Z87898 Personal history of other specified conditions: Secondary | ICD-10-CM | POA: Diagnosis not present

## 2020-07-01 DIAGNOSIS — H9190 Unspecified hearing loss, unspecified ear: Secondary | ICD-10-CM | POA: Diagnosis not present

## 2020-07-01 DIAGNOSIS — Z9221 Personal history of antineoplastic chemotherapy: Secondary | ICD-10-CM | POA: Diagnosis not present

## 2020-07-01 DIAGNOSIS — H6122 Impacted cerumen, left ear: Secondary | ICD-10-CM | POA: Diagnosis not present

## 2020-07-01 DIAGNOSIS — Z85819 Personal history of malignant neoplasm of unspecified site of lip, oral cavity, and pharynx: Secondary | ICD-10-CM | POA: Diagnosis not present

## 2020-07-01 DIAGNOSIS — Z923 Personal history of irradiation: Secondary | ICD-10-CM | POA: Diagnosis not present

## 2020-07-26 ENCOUNTER — Encounter: Payer: Self-pay | Admitting: Cardiology

## 2020-07-26 ENCOUNTER — Other Ambulatory Visit: Payer: Self-pay

## 2020-07-26 ENCOUNTER — Ambulatory Visit: Payer: PPO | Admitting: Cardiology

## 2020-07-26 VITALS — BP 140/76 | HR 90 | Ht 69.0 in | Wt 156.4 lb

## 2020-07-26 DIAGNOSIS — I7090 Unspecified atherosclerosis: Secondary | ICD-10-CM | POA: Insufficient documentation

## 2020-07-26 DIAGNOSIS — Z95828 Presence of other vascular implants and grafts: Secondary | ICD-10-CM

## 2020-07-26 DIAGNOSIS — E782 Mixed hyperlipidemia: Secondary | ICD-10-CM | POA: Diagnosis not present

## 2020-07-26 DIAGNOSIS — I714 Abdominal aortic aneurysm, without rupture, unspecified: Secondary | ICD-10-CM

## 2020-07-26 DIAGNOSIS — Z1329 Encounter for screening for other suspected endocrine disorder: Secondary | ICD-10-CM | POA: Diagnosis not present

## 2020-07-26 HISTORY — DX: Presence of other vascular implants and grafts: Z95.828

## 2020-07-26 HISTORY — DX: Unspecified atherosclerosis: I70.90

## 2020-07-26 LAB — BASIC METABOLIC PANEL
BUN/Creatinine Ratio: 11 (ref 10–24)
BUN: 14 mg/dL (ref 8–27)
CO2: 24 mmol/L (ref 20–29)
Calcium: 10 mg/dL (ref 8.6–10.2)
Chloride: 101 mmol/L (ref 96–106)
Creatinine, Ser: 1.26 mg/dL (ref 0.76–1.27)
Glucose: 96 mg/dL (ref 65–99)
Potassium: 4.5 mmol/L (ref 3.5–5.2)
Sodium: 139 mmol/L (ref 134–144)
eGFR: 60 mL/min/{1.73_m2} (ref >59)

## 2020-07-26 LAB — CBC WITH DIFFERENTIAL/PLATELET
Basophils Absolute: 0.1 10*3/uL (ref 0.0–0.2)
Basos: 1 %
EOS (ABSOLUTE): 0.2 10*3/uL (ref 0.0–0.4)
Eos: 5 %
Hematocrit: 44.1 % (ref 37.5–51.0)
Hemoglobin: 14.7 g/dL (ref 13.0–17.7)
Immature Grans (Abs): 0 10*3/uL (ref 0.0–0.1)
Immature Granulocytes: 0 %
Lymphocytes Absolute: 1 10*3/uL (ref 0.7–3.1)
Lymphs: 27 %
MCH: 29.2 pg (ref 26.6–33.0)
MCHC: 33.3 g/dL (ref 31.5–35.7)
MCV: 88 fL (ref 79–97)
Monocytes Absolute: 0.6 10*3/uL (ref 0.1–0.9)
Monocytes: 15 %
Neutrophils Absolute: 2 10*3/uL (ref 1.4–7.0)
Neutrophils: 52 %
Platelets: 207 10*3/uL (ref 150–450)
RBC: 5.04 x10E6/uL (ref 4.14–5.80)
RDW: 13.4 % (ref 11.6–15.4)
WBC: 3.8 10*3/uL (ref 3.4–10.8)

## 2020-07-26 LAB — LIPID PANEL
Chol/HDL Ratio: 4.7 ratio (ref 0.0–5.0)
Cholesterol, Total: 161 mg/dL (ref 100–199)
HDL: 34 mg/dL — ABNORMAL LOW (ref >39)
LDL Chol Calc (NIH): 104 mg/dL — ABNORMAL HIGH (ref 0–99)
Triglycerides: 126 mg/dL (ref 0–149)
VLDL Cholesterol Cal: 23 mg/dL (ref 5–40)

## 2020-07-26 LAB — HEPATIC FUNCTION PANEL
ALT: 17 IU/L (ref 0–44)
AST: 16 IU/L (ref 0–40)
Albumin: 4.5 g/dL (ref 3.7–4.7)
Alkaline Phosphatase: 129 IU/L — ABNORMAL HIGH (ref 44–121)
Bilirubin Total: 0.6 mg/dL (ref 0.0–1.2)
Bilirubin, Direct: 0.16 mg/dL (ref 0.00–0.40)
Total Protein: 7.1 g/dL (ref 6.0–8.5)

## 2020-07-26 LAB — TSH: TSH: 4.5 u[IU]/mL (ref 0.450–4.500)

## 2020-07-26 NOTE — Patient Instructions (Signed)

## 2020-07-26 NOTE — Progress Notes (Signed)
Cardiology Office Note:    Date:  07/26/2020   ID:  Cody Benjamin, DOB 07/08/1945, MRN [id number]  PCP:  Penelope Coop, FNP  Cardiologist:  Jenean Lindau, MD   Referring MD: Penelope Coop, FNP    ASSESSMENT:    1. Mixed dyslipidemia   2. H/O endovascular stent graft for abdominal aortic aneurysm    PLAN:    In order of problems listed above:  1. Atherosclerotic vascular disease: Abdominal aortic aneurysm postrepair: Secondary prevention stressed with the patient.  Importance of compliance with diet medication stressed any vocalized understanding.  He quit smoking many years ago.  Vascular surgery notes were evaluated ultrasound report was discussed and it is stable.  Post treatment the patient is doing fine.  He is walking on a regular basis. 2. Mixed dyslipidemia: Diet was emphasized and he promises to do better.  He is fasting and will have complete blood work today. 3. Patient will be seen in follow-up appointment in 6 months or earlier if the patient has any concerns    Medication Adjustments/Labs and Tests Ordered: Current medicines are reviewed at length with the patient today.  Concerns regarding medicines are outlined above.  No orders of the defined types were placed in this encounter.  No orders of the defined types were placed in this encounter.    No chief complaint on file.    History of Present Illness:    Cody Benjamin is a 75 y.o. male.  Patient has past medical history of abdominal aortic aneurysm postrepair, history of renal insufficiency and mixed dyslipidemia.  He denies any problems at this time and takes care of activities of daily living.  No chest pain orthopnea or PND.  At the time of my evaluation, the patient is alert awake oriented and in no distress.  Past Medical History:  Diagnosis Date  . AAA (abdominal aortic aneurysm) ([place]) 12/27/2017  . Abdominal aortic aneurysm (AAA) without rupture ([place]) 12/15/2017  . Admission for fitting and  adjustment of vascular catheter 12/15/2017  . Arthritis    right shoulder  . BPH (benign prostatic hyperplasia)   . Chronic kidney disease    history of kidney stones  . Drug therapy   . Elevated blood sugar   . Ex-cigarette smoker 12/15/2017  . Fever   . Gastrostomy in place [facility]) 10/31/2018  . History of kidney stones   . Lipids abnormal   . 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 Mixed dyslipidemia 12/08/2018  . Pneumonia   . Postoperative examination 06/13/2018  . Tonsil cancer ([place]) 05/30/2018    Past Surgical History:  Procedure Laterality Date  . ABDOMINAL AORTIC ENDOVASCULAR STENT GRAFT N/A 12/27/2017   Procedure: ABDOMINAL AORTIC ENDOVASCULAR STENT GRAFT;  Surgeon: Elam Dutch, MD;  Location: [facility] OR;  Service: Vascular;  Laterality: N/A;  . COLONOSCOPY    . SHOULDER SURGERY Right 1969    Current Medications: Current Meds  Medication Sig  . acetaminophen (TYLENOL) 500 MG tablet Take 1,000 mg by mouth every 6 (six) hours as needed for moderate pain or headache.  Marland Kitchen aspirin EC 81 MG tablet Take 81 mg by mouth daily.     Allergies:   Levofloxacin   Social History   Socioeconomic History  . Marital status: Married    Spouse name: Not on file  . Number of children: Not on file  . Years of education: Not on file  . Highest education level: Not on  file  Occupational History  . Not on file  Tobacco Use  . Smoking status: Former Smoker    Quit date: 03/09/2013    Years since quitting: 7.3  . Smokeless tobacco: Never Used  Vaping Use  . Vaping Use: Every day  Substance and Sexual Activity  . Alcohol use: Never  . Drug use: Never  . Sexual activity: Not on file  Other Topics Concern  . Not on file  Social History Narrative  . Not on file   Social Determinants of Health   Financial Resource Strain: Not on file  Food Insecurity: Not on file  Transportation Needs: Not on file  Physical Activity: Not on file  Stress: Not on file  Social  Connections: Not on file     Family History: The patient's family history includes Sohail cancer in his mother; Seizures in his brother.  ROS:   Please see the history of present illness.    All other systems reviewed and are negative.  EKGs/Labs/Other Studies Reviewed:    The following studies were reviewed today: I discussed my findings with the patient in extensive length   Recent Labs: 04/10/2020: ALT 18; BUN 15; Creatinine 1.1; Hemoglobin 14.4; Platelets 234; Potassium 3.9; Sodium 139; TSH 4.020  Recent Lipid Panel    Component Value Date/Time   CHOL 172 01/05/2020 0908   TRIG 102 01/05/2020 0908   HDL 36 (L) 01/05/2020 0908   CHOLHDL 4.8 01/05/2020 0908   LDLCALC 117 (H) 01/05/2020 0908    Physical Exam:    VS:  BP 140/76   Pulse 90   Ht 5\' 9"  (1.753 m)   Wt 156 lb 6.4 oz (70.9 kg)   SpO2 95%   BMI 23.10 kg/m     Wt Readings from Last 3 Encounters:  07/26/20 156 lb 6.4 oz (70.9 kg)  04/10/20 162 lb (73.5 kg)  03/07/20 155 lb (70.3 kg)     GEN: Patient is in no acute distress HEENT: Normal NECK: No JVD; No carotid bruits LYMPHATICS: No lymphadenopathy CARDIAC: Hear sounds regular, 2/6 systolic murmur at the apex. RESPIRATORY:  Clear to auscultation without rales, wheezing or rhonchi  ABDOMEN: Soft, non-tender, non-distended MUSCULOSKELETAL:  No edema; No deformity  SKIN: Warm and dry NEUROLOGIC:  Alert and oriented x 3 PSYCHIATRIC:  Normal affect   Signed, Jenean Lindau, MD  07/26/2020 10:45 AM    [facility]

## 2020-07-29 MED ORDER — EZETIMIBE 10 MG PO TABS: 10.0000 mg | ORAL_TABLET | Freq: Every day | ORAL | 3 refills | Status: DC

## 2020-07-29 NOTE — Addendum Note (Signed)
Addended by: Truddie Hidden on: 07/29/2020 09:00 AM   Modules accepted: Orders

## 2020-08-07 NOTE — Progress Notes (Signed)
[facility]  [address] [place],  [place]  [address] [phone]  Clinic Day:  08/09/2020  Referring physician:  Collier Salina, NP  This document serves as a record of services personally performed by Marice Potter, MD. It was created on their behalf by [facility] E, a trained medical scribe. The creation of this record is based on the scribe's personal observations and the provider's statements to them.  HISTORY OF PRESENT ILLNESS:  The patient is a 75 y.o. 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  His chemotherapy consisted of 3 cycles of cisplatin.  The patient comes in today for routine follow-up.  Since his last visit, the patient has been doing well.  As it pertains to his cancer, he denies having any dysphagia, hoarseness, or other upper aerodigestive symptoms which concern him for disease recurrence.    PHYSICAL EXAM:  Blood pressure 129/61, pulse 89, temperature 98.2 F (36.8 C), resp. rate 16, height 5\' 9"  (1.753 m), weight 154 lb 9.6 oz (70.1 kg), SpO2 98 %. Wt Readings from Last 3 Encounters:  08/09/20 154 lb 9.6 oz (70.1 kg)  07/26/20 156 lb 6.4 oz (70.9 kg)  04/10/20 162 lb (73.5 kg)   Body mass index is 22.83 kg/m. Performance status (ECOG): 1 - Symptomatic but completely ambulatory Physical Exam Constitutional:      Appearance: Normal appearance. He is not ill-appearing.  HENT:     Mouth/Throat:     Mouth: Mucous membranes are moist.     Pharynx: Oropharynx is clear. No oropharyngeal exudate or posterior oropharyngeal erythema.  Cardiovascular:     Rate and Rhythm: Normal rate and regular rhythm.     Heart sounds: No murmur heard. No friction rub. No gallop.   Pulmonary:     Effort: Pulmonary effort is normal. No respiratory distress.     Breath sounds: Normal breath sounds. No wheezing, rhonchi or rales.  Chest:   Breasts:     Right: No axillary adenopathy or supraclavicular adenopathy.     Left: No axillary adenopathy or supraclavicular adenopathy.    Abdominal:     General: Bowel sounds are normal. There is no distension.     Palpations: Abdomen is soft. There is no mass.     Tenderness: There is no abdominal tenderness.  Musculoskeletal:        General: No swelling.     Right lower leg: No edema.     Left lower leg: No edema.  Lymphadenopathy:     Cervical: No cervical adenopathy.     Upper Body:     Right upper body: No supraclavicular or axillary adenopathy.     Left upper body: No supraclavicular or axillary adenopathy.     Lower Body: No right inguinal adenopathy. No left inguinal adenopathy.  Skin:    General: Skin is warm.     Coloration: Skin is not jaundiced.     Findings: No lesion or rash.  Neurological:     General: No focal deficit present.     Mental Status: He is alert and oriented to person, place, and time. Mental status is at baseline.     Cranial Nerves: Cranial nerves are intact.  Psychiatric:        Mood and Affect: Mood normal.        Behavior: Behavior normal.        Thought Content: Thought content  normal.     LABS:   CBC Latest Ref Rng & Units 08/09/2020 07/26/2020 04/10/2020  WBC - 2.5 3.8 6.2  Hemoglobin 13.5 - 17.5 14.6 14.7 14.4  Hematocrit 41 - 53 44 44.1 43  Platelets 150 - 399 185 207 234   CMP Latest Ref Rng & Units 08/09/2020 07/26/2020 04/10/2020  Glucose 65 - 99 mg/dL - 96 -  BUN 4 - 21 20 14 15   Creatinine 0.6 - 1.3 1.0 1.26 1.1  Sodium 137 - 147 135(A) 139 139  Potassium 3.4 - 5.3 3.9 4.5 3.9  Chloride 99 - 108 103 101 106  CO2 13 - 22 25(A) 24 24(A)  Calcium 8.7 - 10.7 8.4(A) 10.0 9.0  Total Protein 6.0 - 8.5 g/dL - 7.1 -  Total Bilirubin 0.0 - 1.2 mg/dL - 0.6 -  Alkaline Phos 25 - 125 98 129(H) 119  AST 14 - 40 33 16 24  ALT 10 - 40 23 17 18     ASSESSMENT & PLAN:  Assessment/Plan:  A 75 y.o. male with stage I (T1 N1 M0) HPV positive  right tonsillar squamous cell carcinoma, status post definitive chemoradiation in May 2020.  Based upon his labs and physical exam today, the patient remains disease-free.  Clinically, the patient appears to be doing very well.  I do feel comfortable spacing all future appointments out to every 6 months. The patient understands all the plans discussed today and is in agreement with them.   I, Rita Ohara, am acting as scribe for Marice Potter, MD    I have reviewed this report as typed by the medical scribe, and it is complete and accurate.  [name], MD

## 2020-08-09 ENCOUNTER — Inpatient Hospital Stay: Payer: PPO

## 2020-08-09 ENCOUNTER — Inpatient Hospital Stay: Payer: PPO | Attending: Oncology | Admitting: Oncology

## 2020-08-09 ENCOUNTER — Other Ambulatory Visit: Payer: Self-pay | Admitting: Oncology

## 2020-08-09 ENCOUNTER — Other Ambulatory Visit: Payer: Self-pay

## 2020-08-09 ENCOUNTER — Other Ambulatory Visit: Payer: Self-pay | Admitting: Hematology and Oncology

## 2020-08-09 ENCOUNTER — Encounter: Payer: Self-pay | Admitting: Oncology

## 2020-08-09 VITALS — BP 129/61 | HR 89 | Temp 98.2°F | Resp 16 | Ht 69.0 in | Wt 154.6 lb

## 2020-08-09 DIAGNOSIS — C099 Malignant neoplasm of tonsil, unspecified: Secondary | ICD-10-CM | POA: Diagnosis not present

## 2020-08-09 DIAGNOSIS — Z79899 Other long term (current) drug therapy: Secondary | ICD-10-CM | POA: Diagnosis not present

## 2020-08-09 DIAGNOSIS — C770 Secondary and unspecified malignant neoplasm of lymph nodes of head, face and neck: Secondary | ICD-10-CM | POA: Diagnosis not present

## 2020-08-09 DIAGNOSIS — D649 Anemia, unspecified: Secondary | ICD-10-CM | POA: Diagnosis not present

## 2020-08-09 LAB — BASIC METABOLIC PANEL
BUN: 20 (ref 4–21)
CO2: 25 — AB (ref 13–22)
Chloride: 103 (ref 99–108)
Creatinine: 1 (ref 0.6–1.3)
Glucose: 150
Potassium: 3.9 (ref 3.4–5.3)
Sodium: 135 — AB (ref 137–147)

## 2020-08-09 LAB — CBC AND DIFFERENTIAL
HCT: 44 (ref 41–53)
Hemoglobin: 14.6 (ref 13.5–17.5)
Neutrophils Absolute: 1.23
Platelets: 185 (ref 150–399)
WBC: 2.5

## 2020-08-09 LAB — TSH: TSH: 4.202 u[IU]/mL (ref 0.350–4.500)

## 2020-08-09 LAB — CBC: RBC: 4.89 (ref 3.87–5.11)

## 2020-08-09 LAB — COMPREHENSIVE METABOLIC PANEL
Albumin: 4.1 (ref 3.5–5.0)
Calcium: 8.4 — AB (ref 8.7–10.7)

## 2020-08-09 LAB — HEPATIC FUNCTION PANEL
ALT: 23 (ref 10–40)
AST: 33 (ref 14–40)
Alkaline Phosphatase: 98 (ref 25–125)
Bilirubin, Total: 0.6

## 2020-08-13 ENCOUNTER — Other Ambulatory Visit: Payer: Self-pay | Admitting: Cardiology

## 2020-08-13 NOTE — Telephone Encounter (Signed)
Refill sent to pharmacy.   

## 2020-09-10 DIAGNOSIS — E782 Mixed hyperlipidemia: Secondary | ICD-10-CM | POA: Diagnosis not present

## 2020-09-10 NOTE — Addendum Note (Signed)
Addended by: Truddie Hidden on: 09/10/2020 01:55 PM   Modules accepted: Orders

## 2020-09-11 LAB — HEPATIC FUNCTION PANEL
ALT: 21 IU/L (ref 0–44)
AST: 21 IU/L (ref 0–40)
Albumin: 4.8 g/dL — ABNORMAL HIGH (ref 3.7–4.7)
Alkaline Phosphatase: 114 IU/L (ref 44–121)
Bilirubin Total: 0.7 mg/dL (ref 0.0–1.2)
Bilirubin, Direct: 0.17 mg/dL (ref 0.00–0.40)
Total Protein: 7.4 g/dL (ref 6.0–8.5)

## 2020-09-11 LAB — LIPID PANEL
Chol/HDL Ratio: 3.6 ratio (ref 0.0–5.0)
Cholesterol, Total: 137 mg/dL (ref 100–199)
HDL: 38 mg/dL — ABNORMAL LOW (ref >39)
LDL Chol Calc (NIH): 79 mg/dL (ref 0–99)
Triglycerides: 109 mg/dL (ref 0–149)
VLDL Cholesterol Cal: 20 mg/dL (ref 5–40)

## 2020-12-30 DIAGNOSIS — Z77122 Contact with and (suspected) exposure to noise: Secondary | ICD-10-CM | POA: Diagnosis not present

## 2020-12-30 DIAGNOSIS — Z85818 Personal history of malignant neoplasm of other sites of lip, oral cavity, and pharynx: Secondary | ICD-10-CM | POA: Diagnosis not present

## 2020-12-30 DIAGNOSIS — J342 Deviated nasal septum: Secondary | ICD-10-CM | POA: Diagnosis not present

## 2020-12-30 DIAGNOSIS — Z87898 Personal history of other specified conditions: Secondary | ICD-10-CM | POA: Diagnosis not present

## 2020-12-30 DIAGNOSIS — Z9221 Personal history of antineoplastic chemotherapy: Secondary | ICD-10-CM | POA: Diagnosis not present

## 2020-12-30 DIAGNOSIS — Z923 Personal history of irradiation: Secondary | ICD-10-CM | POA: Diagnosis not present

## 2020-12-30 DIAGNOSIS — H9190 Unspecified hearing loss, unspecified ear: Secondary | ICD-10-CM | POA: Diagnosis not present

## 2021-01-29 NOTE — Progress Notes (Signed)
[facility]  [address] [place],  [place]  [address] [phone]  Clinic Day:  02/07/2021  Referring physician:  Collier Salina, NP  This document serves as a record of services personally performed by Marice Potter, MD. It was created on their behalf by Curry,Lauren E, a trained medical scribe. The creation of this record is based on the scribe's personal observations and the provider's statements to them.  HISTORY OF PRESENT ILLNESS:  The patient is a 75 y.o. 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  His chemotherapy consisted of 3 cycles of cisplatin.  The patient comes in today for routine follow-up.  Since his last visit, the patient has been doing well.  As it pertains to his cancer, he denies having any dysphagia, hoarseness, or other upper aerodigestive symptoms which concern him for disease recurrence.    PHYSICAL EXAM:  Blood pressure 123/88, pulse 78, temperature 98 F (36.7 C), resp. rate 16, height 5\' 9"  (1.753 m), weight 160 lb 3.2 oz (72.7 kg), SpO2 97 %. Wt Readings from Last 3 Encounters:  02/07/21 160 lb 3.2 oz (72.7 kg)  08/09/20 154 lb 9.6 oz (70.1 kg)  07/26/20 156 lb 6.4 oz (70.9 kg)   Body mass index is 23.66 kg/m. Performance status (ECOG): 1 - Symptomatic but completely ambulatory Physical Exam Constitutional:      Appearance: Normal appearance. He is not ill-appearing.  HENT:     Mouth/Throat:     Mouth: Mucous membranes are moist.     Pharynx: Oropharynx is clear. No oropharyngeal exudate or posterior oropharyngeal erythema.  Cardiovascular:     Rate and Rhythm: Normal rate and regular rhythm.     Heart sounds: No murmur heard.   No friction rub. No gallop.  Pulmonary:     Effort: Pulmonary effort is normal. No respiratory distress.     Breath sounds: Normal breath sounds. No wheezing, rhonchi or rales.   Abdominal:     General: Bowel sounds are normal. There is no distension.     Palpations: Abdomen is soft. There is no mass.     Tenderness: There is no abdominal tenderness.  Musculoskeletal:        General: No swelling.     Right lower leg: No edema.     Left lower leg: No edema.  Lymphadenopathy:     Cervical: No cervical adenopathy.     Upper Body:     Right upper body: No supraclavicular or axillary adenopathy.     Left upper body: No supraclavicular or axillary adenopathy.     Lower Body: No right inguinal adenopathy. No left inguinal adenopathy.  Skin:    General: Skin is warm.     Coloration: Skin is not jaundiced.     Findings: No lesion or rash.  Neurological:     General: No focal deficit present.     Mental Status: He is alert and oriented to person, place, and time. Mental status is at baseline.  Psychiatric:        Mood and Affect: Mood normal.        Behavior: Behavior normal.        Thought Content: Thought content normal.    LABS:   CBC Latest Ref Rng & Units 02/07/2021 08/09/2020 07/26/2020  WBC - 3.5 2.5 3.8  Hemoglobin 13.5 - 17.5 14.5 14.6 14.7  Hematocrit 41 - 53  42 44 44.1  Platelets 150 - 399 230 185 207   CMP Latest Ref Rng & Units 02/07/2021 09/10/2020 08/09/2020  Glucose 65 - 99 mg/dL - - -  BUN 4 - 21 14 - 20  Creatinine 0.6 - 1.3 1.1 - 1.0  Sodium 137 - 147 141 - 135(A)  Potassium 3.4 - 5.3 4.0 - 3.9  Chloride 99 - 108 107 - 103  CO2 13 - 22 26(A) - 25(A)  Calcium 8.7 - 10.7 8.9 - 8.4(A)  Total Protein 6.0 - 8.5 g/dL - 7.4 -  Total Bilirubin 0.0 - 1.2 mg/dL - 0.7 -  Alkaline Phos 25 - 125 99 114 98  AST 14 - 40 29 21 33  ALT 10 - 40 26 21 23     ASSESSMENT & PLAN:  Assessment/Plan:  A 75 y.o. 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  Based upon his labs and physical exam today, the patient remains disease-free.  Clinically, the patient continues to be do  very well.  I will see him back in 6 months for repeat clinical assessment.  The patient understands all the plans discussed today and is in agreement with them.   I, Rita Ohara, am acting as scribe for Marice Potter, MD    I have reviewed this report as typed by the medical scribe, and it is complete and accurate.  [name], MD

## 2021-02-07 ENCOUNTER — Inpatient Hospital Stay: Payer: PPO

## 2021-02-07 ENCOUNTER — Other Ambulatory Visit: Payer: Self-pay | Admitting: Hematology and Oncology

## 2021-02-07 ENCOUNTER — Telehealth: Payer: Self-pay | Admitting: Oncology

## 2021-02-07 ENCOUNTER — Inpatient Hospital Stay: Payer: PPO | Attending: Oncology | Admitting: Oncology

## 2021-02-07 VITALS — BP 123/88 | HR 78 | Temp 98.0°F | Resp 16 | Ht 69.0 in | Wt 160.2 lb

## 2021-02-07 DIAGNOSIS — C771 Secondary and unspecified malignant neoplasm of intrathoracic lymph nodes: Secondary | ICD-10-CM | POA: Diagnosis not present

## 2021-02-07 DIAGNOSIS — C099 Malignant neoplasm of tonsil, unspecified: Secondary | ICD-10-CM | POA: Diagnosis not present

## 2021-02-07 DIAGNOSIS — Z923 Personal history of irradiation: Secondary | ICD-10-CM | POA: Diagnosis not present

## 2021-02-07 DIAGNOSIS — C770 Secondary and unspecified malignant neoplasm of lymph nodes of head, face and neck: Secondary | ICD-10-CM | POA: Diagnosis not present

## 2021-02-07 DIAGNOSIS — Z79899 Other long term (current) drug therapy: Secondary | ICD-10-CM | POA: Insufficient documentation

## 2021-02-07 LAB — COMPREHENSIVE METABOLIC PANEL
Albumin: 4.2 (ref 3.5–5.0)
Calcium: 8.9 (ref 8.7–10.7)

## 2021-02-07 LAB — CBC AND DIFFERENTIAL
HCT: 42 (ref 41–53)
Hemoglobin: 14.5 (ref 13.5–17.5)
Neutrophils Absolute: 2.1
Platelets: 230 (ref 150–399)
WBC: 3.5

## 2021-02-07 LAB — CBC: RBC: 4.8 (ref 3.87–5.11)

## 2021-02-07 LAB — TSH: TSH: 3.87 u[IU]/mL (ref 0.350–4.500)

## 2021-02-07 LAB — HEPATIC FUNCTION PANEL
ALT: 26 (ref 10–40)
AST: 29 (ref 14–40)
Alkaline Phosphatase: 99 (ref 25–125)
Bilirubin, Total: 0.8

## 2021-02-07 LAB — BASIC METABOLIC PANEL
BUN: 14 (ref 4–21)
CO2: 26 — AB (ref 13–22)
Chloride: 107 (ref 99–108)
Creatinine: 1.1 (ref 0.6–1.3)
Glucose: 124
Potassium: 4 (ref 3.4–5.3)
Sodium: 141 (ref 137–147)

## 2021-02-07 MED ORDER — SODIUM CHLORIDE 0.9% FLUSH
10.0000 mL | INTRAVENOUS | Status: DC | PRN
  Administered 2021-02-07: 10 mL

## 2021-02-07 MED ORDER — HEPARIN SOD (PORK) LOCK FLUSH 100 UNIT/ML IV SOLN
500.0000 [IU] | Freq: Once | INTRAVENOUS | Status: AC | PRN
  Administered 2021-02-07: 500 [IU]

## 2021-02-07 NOTE — Addendum Note (Signed)
Addended byGeorgette Shell on: 02/07/2021 02:43 PM   Modules accepted: Orders

## 2021-02-07 NOTE — Telephone Encounter (Signed)
Per 12/2 los next appt scheduled and given to patient

## 2021-02-17 DIAGNOSIS — M25511 Pain in right shoulder: Secondary | ICD-10-CM | POA: Diagnosis not present

## 2021-02-17 DIAGNOSIS — M7541 Impingement syndrome of right shoulder: Secondary | ICD-10-CM | POA: Diagnosis not present

## 2021-02-21 DIAGNOSIS — M25511 Pain in right shoulder: Secondary | ICD-10-CM | POA: Diagnosis not present

## 2021-02-24 DIAGNOSIS — M25511 Pain in right shoulder: Secondary | ICD-10-CM | POA: Diagnosis not present

## 2021-02-25 ENCOUNTER — Other Ambulatory Visit: Payer: Self-pay

## 2021-02-25 DIAGNOSIS — I714 Abdominal aortic aneurysm, without rupture, unspecified: Secondary | ICD-10-CM

## 2021-02-26 ENCOUNTER — Other Ambulatory Visit: Payer: Self-pay

## 2021-03-05 DIAGNOSIS — M25511 Pain in right shoulder: Secondary | ICD-10-CM | POA: Diagnosis not present

## 2021-03-12 DIAGNOSIS — M25511 Pain in right shoulder: Secondary | ICD-10-CM | POA: Diagnosis not present

## 2021-03-14 ENCOUNTER — Ambulatory Visit (HOSPITAL_COMMUNITY)
Admission: RE | Admit: 2021-03-14 | Discharge: 2021-03-14 | Disposition: A | Discharge status: Home / Self Care | Payer: PPO | Source: Ambulatory Visit | Attending: Physician Assistant | Admitting: Physician Assistant

## 2021-03-14 ENCOUNTER — Other Ambulatory Visit: Payer: Self-pay

## 2021-03-14 ENCOUNTER — Ambulatory Visit: Payer: PPO | Admitting: Physician Assistant

## 2021-03-14 ENCOUNTER — Encounter: Payer: Self-pay | Admitting: Physician Assistant

## 2021-03-14 VITALS — BP 135/84 | HR 90 | Temp 97.8°F | Resp 20 | Ht 69.0 in | Wt 154.3 lb

## 2021-03-14 DIAGNOSIS — K439 Ventral hernia without obstruction or gangrene: Secondary | ICD-10-CM | POA: Diagnosis not present

## 2021-03-14 DIAGNOSIS — I714 Abdominal aortic aneurysm, without rupture, unspecified: Secondary | ICD-10-CM | POA: Diagnosis not present

## 2021-03-14 NOTE — Progress Notes (Signed)
[facility] HISTORY AND PHYSICAL   History of Present Illness:  Patient is a 76 y.o. year old male who presents for evaluation of AAA s/p EVAR by Dr. Oneida Alar 03/07/2018.  The aneurysm was 6.5 cm prior to repair.  He denise claudication, rest pain or non healing wounds.  He denise lumbar or abdominal pain.  He states he has had trouble getting rid of the weight in his abdomin area since his surgery.    He is medically managed on ASA, Lipitor and Zetia.  Past Medical History:  Diagnosis Date   AAA (abdominal aortic aneurysm) 12/27/2017   Abdominal aortic aneurysm (AAA) without rupture 12/15/2017   Admission for fitting and adjustment of vascular catheter 12/15/2017   Arthritis    right shoulder   Atherosclerotic vascular disease 07/26/2020   BPH (benign prostatic hyperplasia)    Chronic kidney disease    history of kidney stones   Drug therapy    Elevated blood sugar    Ex-cigarette smoker 12/15/2017   Fever    Gastrostomy in place [facility]) 10/31/2018   H/O endovascular stent graft for abdominal aortic aneurysm 07/26/2020   History of kidney stones    Lipids abnormal    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 ([place]) 05/30/2018   Mixed dyslipidemia 12/08/2018   Pneumonia    Postoperative examination 06/13/2018   Tonsil cancer ([place]) 05/30/2018    Past Surgical History:  Procedure Laterality Date   ABDOMINAL AORTIC ENDOVASCULAR STENT GRAFT N/A 12/27/2017   Procedure: ABDOMINAL AORTIC ENDOVASCULAR STENT GRAFT;  Surgeon: Elam Dutch, MD;  Location: [facility] OR;  Service: Vascular;  Laterality: N/A;   COLONOSCOPY     SHOULDER SURGERY Right 1969    ROS:   General:  No weight loss, Fever, chills  HEENT: No recent headaches, no nasal bleeding, no visual changes, no sore throat  Neurologic: No dizziness, blackouts, seizures. No recent symptoms of stroke or mini- stroke. No recent episodes of slurred speech, or temporary blindness.  Cardiac: No recent episodes of chest  pain/pressure, no shortness of breath at rest.  No shortness of breath with exertion.  Denies history of atrial fibrillation or irregular heartbeat  Vascular: No history of rest pain in feet.  No history of claudication.  No history of non-healing ulcer, No history of DVT   Pulmonary: No home oxygen, no productive cough, no hemoptysis,  No asthma or wheezing  Musculoskeletal:  [ ]  Arthritis, [ ]  Low back pain,  [ ]  Joint pain  Hematologic:No history of hypercoagulable state.  No history of easy bleeding.  No history of anemia  Gastrointestinal: No hematochezia or melena,  No gastroesophageal reflux, no trouble swallowing  Urinary: [ ]  chronic Kidney disease, [ ]  on HD - [ ]  MWF or [ ]  TTHS, [ ]  Burning with urination, [ ]  Frequent urination, [ ]  Difficulty urinating;   Skin: No rashes  Psychological: No history of anxiety,  No history of depression  Social History Social History   Tobacco Use   Smoking status: Former    Types: Cigarettes    Quit date: 03/09/2013    Years since quitting: 8.0   Smokeless tobacco: Never  Vaping Use   Vaping Use: Every day  Substance Use Topics   Alcohol use: Never   Drug use: Never    Family History Family History  Problem Relation Age of Onset   Lam cancer Mother    Seizures Brother     Allergies  Allergies  Allergen Reactions   Levofloxacin Nausea And Vomiting    Other reaction(s): Chills (intolerance)     Current Outpatient Medications  Medication Sig Dispense Refill   acetaminophen (TYLENOL) 500 MG tablet Take 1,000 mg by mouth every 6 (six) hours as needed for moderate pain or headache.     aspirin EC 81 MG tablet Take 81 mg by mouth daily.     atorvastatin (LIPITOR) 80 MG tablet TAKE 1 TABLET(80 MG) BY MOUTH DAILY 90 tablet 3   ezetimibe (ZETIA) 10 MG tablet Take 10 mg by mouth daily.     meloxicam (MOBIC) 15 MG tablet Take 15 mg by mouth daily. (Patient not taking: Reported on 03/14/2021)     No current  facility-administered medications for this visit.    Physical Examination  Vitals:   03/14/21 0927  BP: 135/84  Pulse: 90  Resp: 20  Temp: 97.8 F (36.6 C)  TempSrc: Temporal  SpO2: 97%  Weight: 154 lb 4.8 oz (70 kg)  Height: 5\' 9"  (1.753 m)    Body mass index is 22.79 kg/m.  General:  Alert and oriented, no acute distress HEENT: Normal Neck: No bruit or JVD Pulmonary: Clear to auscultation bilaterally Cardiac: Regular Rate and Rhythm without murmur Abdomen: Soft, non-tender, central abdominal hernia that is compressible on exam, no mass, no scars Skin: No rash Extremity Pulses:  2+ radial, brachial, femoral, dorsalis pedis, posterior tibial pulses bilaterally Musculoskeletal: No deformity or edema  Neurologic: Upper and lower extremity motor 5/5 and symmetric  DATA:     Endovascular Aortic Repair (EVAR):  +----------+----------------+-------------------+-------------------+              Diameter AP (cm) Diameter Trans (cm) Velocities (cm/sec)   +----------+----------------+-------------------+-------------------+   Aorta      4.94             5.20                67                    +----------+----------------+-------------------+-------------------+   Right Limb 1.18             1.10                44                    +----------+----------------+-------------------+-------------------+   Left Limb  1.26             1.10                61                    +----------+----------------+-------------------+-------------------+    Summary:  Abdominal Aorta: Patent endovascular aneurysm repair with no evidence of  endoleak. Previous diameter measurement was 5.3 x 5.32 cm obtained on  03/07/20.   ASSESSMENT:  AAA s/p EVAR repair by Dr. Oneida Alar  He has no symptoms of ischemia in B LE.  He has palpable pedal pulses B.   The EVAR duplex shows the sac has shrunk from it's original size and is now 5.2 at it's largest diameter which is unchanged from previous duplex  and stable.  Central abdominal hernia that is compressible on exam.  He states he has not been able to loose his belly weight.  He had no indication of abdominal hernia prior to today's exam.   PLAN: Continue activity as tolerates.  He will f/u with his primary care physician  to discuss work of abdominal hernia.  We discussed as long as he is not have abdominal pain and his abdomin remains soft the hernia is stable.  His AAA EVAR is stable with palpable pedal pulses B.  He will f/u for EVAR duplex in 1 year.  Call PRN with concerns.   Roxy Horseman PA-C [facility] [place] Office: [phone]  MD in office [place]

## 2021-03-17 ENCOUNTER — Other Ambulatory Visit: Payer: Self-pay

## 2021-03-17 ENCOUNTER — Encounter: Payer: Self-pay | Admitting: Cardiology

## 2021-03-17 ENCOUNTER — Ambulatory Visit (INDEPENDENT_AMBULATORY_CARE_PROVIDER_SITE_OTHER): Payer: PPO | Admitting: Cardiology

## 2021-03-17 VITALS — BP 110/80 | HR 91 | Ht 69.0 in | Wt 155.0 lb

## 2021-03-17 DIAGNOSIS — Z95828 Presence of other vascular implants and grafts: Secondary | ICD-10-CM

## 2021-03-17 DIAGNOSIS — E782 Mixed hyperlipidemia: Secondary | ICD-10-CM | POA: Diagnosis not present

## 2021-03-17 NOTE — Progress Notes (Signed)
Cardiology Office Note:    Date:  03/17/2021   ID:  Cody Benjamin, DOB 1945/11/28, MRN [id number]  PCP:  Cody Coop, FNP  Cardiologist:  Cody Lindau, MD   Referring MD: Cody Coop, FNP    ASSESSMENT:    1. Mixed dyslipidemia   2. H/O endovascular stent graft for abdominal aortic aneurysm    PLAN:    In order of problems listed above:  Atherosclerotic vascular disease post abdominal aortic stent aneurysm post stent graft: Secondary prevention stressed with the patient.  Importance of compliance with diet medication stressed any vocalized understanding. Mixed dyslipidemia: Diet was emphasized.  Lifestyle modification understanding Blood work today at her office. Ex-smoker: Promises to never go back to smoking. Patient has history of cancer being followed by primary care. He was advised to walk on a regular basis to the best of his ability.  He promises to do so. Patient will be seen in follow-up appointment in 6 months or earlier if the patient has any concerns    Medication Adjustments/Labs and Tests Ordered: Current medicines are reviewed at length with the patient today.  Concerns regarding medicines are outlined above.  Orders Placed This Encounter  Procedures   Basic metabolic panel   Lipid panel   Hepatic function panel   EKG 12-Lead   No orders of the defined types were placed in this encounter.    Chief Complaint  Patient presents with   Follow-up     History of Present Illness:    Cody Benjamin is a 76 y.o. male.  Patient has past medical history of abdominal aortic aneurysm repair and dyslipidemia.  He is an ex-smoker and stopped about 8 weeks ago.  He denies any chest pain orthopnea or PND.  He takes care of activities of daily living.  No chest pain orthopnea or PND.  At the time of my evaluation, the patient is alert awake oriented and in no distress.  Past Medical History:  Diagnosis Date   AAA (abdominal aortic aneurysm) 12/27/2017    Abdominal aortic aneurysm (AAA) without rupture 12/15/2017   Admission for fitting and adjustment of vascular catheter 12/15/2017   Arthritis    right shoulder   Atherosclerotic vascular disease 07/26/2020   BPH (benign prostatic hyperplasia)    Chronic kidney disease    history of kidney stones   Drug therapy    Elevated blood sugar    Ex-cigarette smoker 12/15/2017   Fever    Gastrostomy in place [facility]) 10/31/2018   H/O endovascular stent graft for abdominal aortic aneurysm 07/26/2020   History of kidney stones    Lipids abnormal    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 ([place]) 05/30/2018   Mixed dyslipidemia 12/08/2018   Pneumonia    Postoperative examination 06/13/2018   Tonsil cancer ([place]) 05/30/2018    Past Surgical History:  Procedure Laterality Date   ABDOMINAL AORTIC ENDOVASCULAR STENT GRAFT N/A 12/27/2017   Procedure: ABDOMINAL AORTIC ENDOVASCULAR STENT GRAFT;  Surgeon: Cody Dutch, MD;  Location: [facility] OR;  Service: Vascular;  Laterality: N/A;   COLONOSCOPY     SHOULDER SURGERY Right 1969    Current Medications: Current Meds  Medication Sig   aspirin EC 81 MG tablet Take 81 mg by mouth daily.   atorvastatin (LIPITOR) 80 MG tablet TAKE 1 TABLET(80 MG) BY MOUTH DAILY   ezetimibe (ZETIA) 10 MG tablet Take 10 mg by mouth daily.   meloxicam (MOBIC) 15 MG tablet Take 15 mg  by mouth daily.     Allergies:   Levofloxacin   Social History   Socioeconomic History   Marital status: Married    Spouse name: Not on file   Number of children: Not on file   Years of education: Not on file   Highest education level: Not on file  Occupational History   Not on file  Tobacco Use   Smoking status: Former    Types: Cigarettes    Quit date: 03/09/2013    Years since quitting: 8.0   Smokeless tobacco: Never  Vaping Use   Vaping Use: Every day  Substance and Sexual Activity   Alcohol use: Never   Drug use: Never   Sexual activity: Not on file  Other Topics Concern   Not on file   Social History Narrative   Not on file   Social Determinants of Health   Financial Resource Strain: Not on file  Food Insecurity: Not on file  Transportation Needs: Not on file  Physical Activity: Not on file  Stress: Not on file  Social Connections: Not on file     Family History: The patient's family history includes Cody Benjamin cancer in his mother; Cody Benjamin in his brother.  ROS:   Please see the history of present illness.    All other systems reviewed and are negative.  EKGs/Labs/Other Studies Reviewed:    The following studies were reviewed today: I discussed my findings with the patient at length.   Recent Labs: 02/07/2021: ALT 26; BUN 14; Creatinine 1.1; Hemoglobin 14.5; Platelets 230; Potassium 4.0; Sodium 141; TSH 3.870  Recent Lipid Panel    Component Value Date/Time   CHOL 137 09/10/2020 1401   TRIG 109 09/10/2020 1401   HDL 38 (L) 09/10/2020 1401   CHOLHDL 3.6 09/10/2020 1401   LDLCALC 79 09/10/2020 1401    Physical Exam:    VS:  BP 110/80 (BP Location: Right Arm, Patient Position: Sitting, Cuff Size: Normal)    Pulse 91    Ht 5\' 9"  (1.753 m)    Wt 155 lb (70.3 kg)    SpO2 98%    BMI 22.89 kg/m     Wt Readings from Last 3 Encounters:  03/17/21 155 lb (70.3 kg)  03/14/21 154 lb 4.8 oz (70 kg)  02/07/21 160 lb 3.2 oz (72.7 kg)     GEN: Patient is in no acute distress HEENT: Normal NECK: No JVD; No carotid bruits LYMPHATICS: No lymphadenopathy CARDIAC: Hear sounds regular, 2/6 systolic murmur at the apex. RESPIRATORY:  Clear to auscultation without rales, wheezing or rhonchi  ABDOMEN: Soft, non-tender, non-distended MUSCULOSKELETAL:  No edema; No deformity  SKIN: Warm and dry NEUROLOGIC:  Alert and oriented x 3 PSYCHIATRIC:  Normal affect   Signed, Cody Lindau, MD  03/17/2021 11:07 AM    Margaret

## 2021-03-17 NOTE — Patient Instructions (Signed)
Medication Instructions:  Your physician recommends that you continue on your current medications as directed. Please refer to the Current Medication list given to you today.  *If you need a refill on your cardiac medications before your next appointment, please call your pharmacy*   Lab Work: Your physician recommends that you return for lab work in: TODAY Lipids, LFT'S, BMP If you have labs (blood work) drawn today and your tests are completely normal, you will receive your results only by: [place] (if you have [place]) OR A paper copy in the mail If you have any lab test that is abnormal or we need to change your treatment, we will call you to review the results.   Testing/Procedures: None   Follow-Up: At [facility], you and your health needs are our priority.  As part of our continuing mission to provide you with exceptional heart care, we have created designated Provider Care Teams.  These Care Teams include your primary Cardiologist (physician) and Advanced Practice Providers (APPs -  Physician Assistants and Nurse Practitioners) who all work together to provide you with the care you need, when you need it.  We recommend signing up for the patient portal called "MyChart".  Sign up information is provided on this After Visit Summary.  MyChart is used to connect with patients for Virtual Visits (Telemedicine).  Patients are able to view lab/test results, encounter notes, upcoming appointments, etc.  Non-urgent messages can be sent to your provider as well.   To learn more about what you can do with MyChart, go to [email or web address].    Your next appointment:   9 month(s)  The format for your next appointment:   In Person  Provider:   Jyl Heinz, MD    Other Instructions

## 2021-03-18 LAB — BASIC METABOLIC PANEL
BUN/Creatinine Ratio: 15 (ref 10–24)
BUN: 18 mg/dL (ref 8–27)
CO2: 25 mmol/L (ref 20–29)
Calcium: 9.7 mg/dL (ref 8.6–10.2)
Chloride: 102 mmol/L (ref 96–106)
Creatinine, Ser: 1.2 mg/dL (ref 0.76–1.27)
Glucose: 100 mg/dL — ABNORMAL HIGH (ref 70–99)
Potassium: 4.8 mmol/L (ref 3.5–5.2)
Sodium: 141 mmol/L (ref 134–144)
eGFR: 64 mL/min/{1.73_m2} (ref >59)

## 2021-03-18 LAB — LIPID PANEL
Chol/HDL Ratio: 3.3 ratio (ref 0.0–5.0)
Cholesterol, Total: 140 mg/dL (ref 100–199)
HDL: 42 mg/dL (ref >39)
LDL Chol Calc (NIH): 81 mg/dL (ref 0–99)
Triglycerides: 90 mg/dL (ref 0–149)
VLDL Cholesterol Cal: 17 mg/dL (ref 5–40)

## 2021-03-18 LAB — HEPATIC FUNCTION PANEL
ALT: 24 IU/L (ref 0–44)
AST: 16 IU/L (ref 0–40)
Albumin: 4.3 g/dL (ref 3.7–4.7)
Alkaline Phosphatase: 112 IU/L (ref 44–121)
Bilirubin Total: 0.8 mg/dL (ref 0.0–1.2)
Bilirubin, Direct: 0.19 mg/dL (ref 0.00–0.40)
Total Protein: 6.8 g/dL (ref 6.0–8.5)

## 2021-03-19 DIAGNOSIS — M25511 Pain in right shoulder: Secondary | ICD-10-CM | POA: Diagnosis not present

## 2021-03-19 DIAGNOSIS — Z0001 Encounter for general adult medical examination with abnormal findings: Secondary | ICD-10-CM | POA: Diagnosis not present

## 2021-03-19 DIAGNOSIS — R5381 Other malaise: Secondary | ICD-10-CM | POA: Diagnosis not present

## 2021-03-19 DIAGNOSIS — Z125 Encounter for screening for malignant neoplasm of prostate: Secondary | ICD-10-CM | POA: Diagnosis not present

## 2021-03-19 DIAGNOSIS — J309 Allergic rhinitis, unspecified: Secondary | ICD-10-CM | POA: Diagnosis not present

## 2021-03-19 DIAGNOSIS — Z20828 Contact with and (suspected) exposure to other viral communicable diseases: Secondary | ICD-10-CM | POA: Diagnosis not present

## 2021-03-19 DIAGNOSIS — E559 Vitamin D deficiency, unspecified: Secondary | ICD-10-CM | POA: Diagnosis not present

## 2021-03-19 DIAGNOSIS — J019 Acute sinusitis, unspecified: Secondary | ICD-10-CM | POA: Diagnosis not present

## 2021-03-31 DIAGNOSIS — M7541 Impingement syndrome of right shoulder: Secondary | ICD-10-CM | POA: Diagnosis not present

## 2021-03-31 DIAGNOSIS — Z452 Encounter for adjustment and management of vascular access device: Secondary | ICD-10-CM | POA: Diagnosis not present

## 2021-04-01 ENCOUNTER — Telehealth: Payer: Self-pay | Admitting: Physician Assistant

## 2021-04-01 ENCOUNTER — Telehealth: Payer: Self-pay

## 2021-04-01 NOTE — Telephone Encounter (Signed)
° °  Pre-operative Risk Assessment    Patient Name: Cody Benjamin  DOB: Dec 30, 1945 MRN: [id number]      Request for Surgical Clearance    Procedure:   Port-a-cath removal  Date of Surgery:  Clearance TBD                                 Surgeon:  Theadore Nan, MD Surgeon's Group or Practice Name:  [facility] Phone number:  [phone]  Fax number:  [phone]   Type of Clearance Requested:   - Medical    Type of Anesthesia:  General    Additional requests/questions:    SignedTruddie Hidden   04/01/2021, 7:34 AM

## 2021-04-01 NOTE — Telephone Encounter (Signed)
° °  Name: Cody Benjamin  DOB: Apr 11, 1945  MRN: [id number]   Primary Cardiologist: Jenean Lindau, MD  Chart reviewed as part of pre-operative protocol coverage. Patient was contacted 04/01/2021 in reference to pre-operative risk assessment for pending surgery as outlined below.  Jeanne E Zecca was last seen on 03/17/21 by Dr. Larina Bras.  Since that day, Jonthan E Dimperio has done well.  He is able to complete more than 4.0 METS without angina.   Therefore, based on ACC/AHA guidelines, the patient would be at acceptable risk for the planned procedure without further cardiovascular testing.   The patient was advised that if he develops new symptoms prior to surgery to contact our office to arrange for a follow-up visit, and he verbalized understanding.  I will route this recommendation to the requesting party via Epic fax function and remove from pre-op pool. Please call with questions.  Tami Lin Lincon Sahlin, PA 04/01/2021, 4:45 PM

## 2021-04-01 NOTE — Telephone Encounter (Signed)
Left VM - pt is golfing.  He is able to walk more than 1.0 mile without angina, per his wife. He will call back to confirm this information.

## 2021-04-02 DIAGNOSIS — M545 Low back pain, unspecified: Secondary | ICD-10-CM | POA: Diagnosis not present

## 2021-04-02 DIAGNOSIS — Z6823 Body mass index (BMI) 23.0-23.9, adult: Secondary | ICD-10-CM | POA: Diagnosis not present

## 2021-04-02 DIAGNOSIS — R350 Frequency of micturition: Secondary | ICD-10-CM | POA: Diagnosis not present

## 2021-04-02 DIAGNOSIS — Z1331 Encounter for screening for depression: Secondary | ICD-10-CM | POA: Diagnosis not present

## 2021-04-02 DIAGNOSIS — J309 Allergic rhinitis, unspecified: Secondary | ICD-10-CM | POA: Diagnosis not present

## 2021-04-02 DIAGNOSIS — Z008 Encounter for other general examination: Secondary | ICD-10-CM | POA: Diagnosis not present

## 2021-04-02 DIAGNOSIS — Z13820 Encounter for screening for osteoporosis: Secondary | ICD-10-CM | POA: Diagnosis not present

## 2021-04-02 DIAGNOSIS — E559 Vitamin D deficiency, unspecified: Secondary | ICD-10-CM | POA: Diagnosis not present

## 2021-04-02 DIAGNOSIS — Z7189 Other specified counseling: Secondary | ICD-10-CM | POA: Diagnosis not present

## 2021-04-23 DIAGNOSIS — I2510 Atherosclerotic heart disease of native coronary artery without angina pectoris: Secondary | ICD-10-CM | POA: Diagnosis not present

## 2021-04-23 DIAGNOSIS — Z452 Encounter for adjustment and management of vascular access device: Secondary | ICD-10-CM | POA: Diagnosis not present

## 2021-04-23 DIAGNOSIS — Z8509 Personal history of malignant neoplasm of other digestive organs: Secondary | ICD-10-CM | POA: Diagnosis not present

## 2021-04-23 DIAGNOSIS — Z87891 Personal history of nicotine dependence: Secondary | ICD-10-CM | POA: Diagnosis not present

## 2021-04-23 DIAGNOSIS — C099 Malignant neoplasm of tonsil, unspecified: Secondary | ICD-10-CM | POA: Diagnosis not present

## 2021-04-30 DIAGNOSIS — I739 Peripheral vascular disease, unspecified: Secondary | ICD-10-CM | POA: Diagnosis not present

## 2021-04-30 DIAGNOSIS — Z6823 Body mass index (BMI) 23.0-23.9, adult: Secondary | ICD-10-CM | POA: Diagnosis not present

## 2021-04-30 DIAGNOSIS — E782 Mixed hyperlipidemia: Secondary | ICD-10-CM | POA: Diagnosis not present

## 2021-04-30 DIAGNOSIS — J0100 Acute maxillary sinusitis, unspecified: Secondary | ICD-10-CM | POA: Diagnosis not present

## 2021-04-30 DIAGNOSIS — Z85818 Personal history of malignant neoplasm of other sites of lip, oral cavity, and pharynx: Secondary | ICD-10-CM | POA: Diagnosis not present

## 2021-05-07 ENCOUNTER — Other Ambulatory Visit: Payer: Self-pay | Admitting: Cardiology

## 2021-06-15 DIAGNOSIS — I700 Atherosclerosis of aorta: Secondary | ICD-10-CM | POA: Diagnosis not present

## 2021-06-15 DIAGNOSIS — E860 Dehydration: Secondary | ICD-10-CM | POA: Diagnosis not present

## 2021-06-15 DIAGNOSIS — R42 Dizziness and giddiness: Secondary | ICD-10-CM | POA: Diagnosis not present

## 2021-06-15 DIAGNOSIS — M25511 Pain in right shoulder: Secondary | ICD-10-CM | POA: Diagnosis not present

## 2021-06-15 DIAGNOSIS — I6523 Occlusion and stenosis of bilateral carotid arteries: Secondary | ICD-10-CM | POA: Diagnosis not present

## 2021-06-15 DIAGNOSIS — R519 Headache, unspecified: Secondary | ICD-10-CM | POA: Diagnosis not present

## 2021-06-15 DIAGNOSIS — I160 Hypertensive urgency: Secondary | ICD-10-CM | POA: Diagnosis not present

## 2021-06-15 DIAGNOSIS — Z87891 Personal history of nicotine dependence: Secondary | ICD-10-CM | POA: Diagnosis not present

## 2021-06-15 DIAGNOSIS — I252 Old myocardial infarction: Secondary | ICD-10-CM | POA: Diagnosis not present

## 2021-06-16 DIAGNOSIS — R519 Headache, unspecified: Secondary | ICD-10-CM | POA: Diagnosis not present

## 2021-06-16 DIAGNOSIS — I700 Atherosclerosis of aorta: Secondary | ICD-10-CM | POA: Diagnosis not present

## 2021-06-16 DIAGNOSIS — I6523 Occlusion and stenosis of bilateral carotid arteries: Secondary | ICD-10-CM | POA: Diagnosis not present

## 2021-06-16 DIAGNOSIS — R42 Dizziness and giddiness: Secondary | ICD-10-CM | POA: Diagnosis not present

## 2021-06-17 ENCOUNTER — Telehealth: Payer: Self-pay | Admitting: Cardiology

## 2021-06-17 NOTE — Telephone Encounter (Signed)
Juliann Pulse states that she had threw pt's BP log away but that his BP has been 140-170's over 80-90's. Pt has been in increased pain with his shoulder. Dr. Geraldo Pitter advised to see PCP or ortho to discuss as these mediations and pain can cause increased BP. Juliann Pulse verbalized understanding and had no additional questions. ?

## 2021-06-17 NOTE — Telephone Encounter (Signed)
Pt c/o BP issue: STAT if pt c/o blurred vision, one-sided weakness or slurred speech ? ?1. What are your last 5 BP readings? 148/92, 178/92, ? ?2. Are you having any other symptoms (ex. Dizziness, headache, blurred vision, passed out)? Lightheaded, dizziness, head feeling weird right now ? ?3. What is your BP issue? Blood pressure running high, wants to be seen- went to [facility] on  Sunday(06-15-21) for his blood pressure-  patient wants to be seem ?

## 2021-06-18 DIAGNOSIS — R030 Elevated blood-pressure reading, without diagnosis of hypertension: Secondary | ICD-10-CM | POA: Diagnosis not present

## 2021-06-18 DIAGNOSIS — Z6823 Body mass index (BMI) 23.0-23.9, adult: Secondary | ICD-10-CM | POA: Diagnosis not present

## 2021-06-18 DIAGNOSIS — K219 Gastro-esophageal reflux disease without esophagitis: Secondary | ICD-10-CM | POA: Diagnosis not present

## 2021-06-18 DIAGNOSIS — J439 Emphysema, unspecified: Secondary | ICD-10-CM | POA: Diagnosis not present

## 2021-06-18 DIAGNOSIS — I700 Atherosclerosis of aorta: Secondary | ICD-10-CM | POA: Diagnosis not present

## 2021-06-18 DIAGNOSIS — Z79899 Other long term (current) drug therapy: Secondary | ICD-10-CM | POA: Diagnosis not present

## 2021-06-30 DIAGNOSIS — Z87891 Personal history of nicotine dependence: Secondary | ICD-10-CM | POA: Diagnosis not present

## 2021-06-30 DIAGNOSIS — H9190 Unspecified hearing loss, unspecified ear: Secondary | ICD-10-CM | POA: Diagnosis not present

## 2021-06-30 DIAGNOSIS — J343 Hypertrophy of nasal turbinates: Secondary | ICD-10-CM | POA: Diagnosis not present

## 2021-06-30 DIAGNOSIS — Z923 Personal history of irradiation: Secondary | ICD-10-CM | POA: Diagnosis not present

## 2021-06-30 DIAGNOSIS — J342 Deviated nasal septum: Secondary | ICD-10-CM | POA: Diagnosis not present

## 2021-06-30 DIAGNOSIS — R432 Parageusia: Secondary | ICD-10-CM | POA: Diagnosis not present

## 2021-06-30 DIAGNOSIS — J309 Allergic rhinitis, unspecified: Secondary | ICD-10-CM | POA: Diagnosis not present

## 2021-06-30 DIAGNOSIS — Z85818 Personal history of malignant neoplasm of other sites of lip, oral cavity, and pharynx: Secondary | ICD-10-CM | POA: Diagnosis not present

## 2021-06-30 DIAGNOSIS — Z9221 Personal history of antineoplastic chemotherapy: Secondary | ICD-10-CM | POA: Diagnosis not present

## 2021-07-02 DIAGNOSIS — M19011 Primary osteoarthritis, right shoulder: Secondary | ICD-10-CM | POA: Diagnosis not present

## 2021-07-21 ENCOUNTER — Other Ambulatory Visit: Payer: Self-pay | Admitting: Cardiology

## 2021-07-28 ENCOUNTER — Telehealth: Payer: Self-pay | Admitting: Cardiology

## 2021-07-28 ENCOUNTER — Other Ambulatory Visit: Payer: Self-pay

## 2021-07-28 DIAGNOSIS — H903 Sensorineural hearing loss, bilateral: Secondary | ICD-10-CM | POA: Diagnosis not present

## 2021-07-28 DIAGNOSIS — H93299 Other abnormal auditory perceptions, unspecified ear: Secondary | ICD-10-CM | POA: Diagnosis not present

## 2021-07-28 MED ORDER — EZETIMIBE 10 MG PO TABS: 10.0000 mg | ORAL_TABLET | Freq: Every day | ORAL | 3 refills | Status: AC

## 2021-07-28 NOTE — Telephone Encounter (Signed)
Prescription refill for Zetia sent to patient's pharmacy. Patient is aware.

## 2021-07-28 NOTE — Telephone Encounter (Signed)
Pt needs refill on [employer] number [phone] Preferred pharmacy [facility] on [address]

## 2021-07-30 NOTE — Telephone Encounter (Signed)
Refill has already been sent to the pharmacy.

## 2021-08-07 NOTE — Progress Notes (Signed)
[facility]  [address] [place],    [address] [phone]  Clinic Day:  08/08/2020  Referring physician:  Collier Salina, NP   HISTORY OF PRESENT ILLNESS:  The patient is a 76 y.o. 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  His chemotherapy consisted of 3 cycles of cisplatin.  The patient comes in today for routine follow-up.  Since his last visit, the patient has been doing well.  As it pertains to his cancer, he denies having any dysphagia, hoarseness, or other upper aerodigestive symptoms which concern him for disease recurrence.    PHYSICAL EXAM:  Blood pressure (!) 154/75, pulse 79, temperature 97.6 F (36.4 C), resp. rate 18, height '5\' 9"'$  (1.753 m), weight 157 lb 11.2 oz (71.5 kg), SpO2 98 %. Wt Readings from Last 3 Encounters:  08/08/21 157 lb 11.2 oz (71.5 kg)  03/17/21 155 lb (70.3 kg)  03/14/21 154 lb 4.8 oz (70 kg)   Body mass index is 23.29 kg/m. Performance status (ECOG): 1 - Symptomatic but completely ambulatory Physical Exam Constitutional:      Appearance: Normal appearance. He is not ill-appearing.  HENT:     Mouth/Throat:     Mouth: Mucous membranes are moist.     Pharynx: Oropharynx is clear. No oropharyngeal exudate or posterior oropharyngeal erythema.  Cardiovascular:     Rate and Rhythm: Normal rate and regular rhythm.     Heart sounds: No murmur heard.   No friction rub. No gallop.  Pulmonary:     Effort: Pulmonary effort is normal. No respiratory distress.     Breath sounds: Normal breath sounds. No wheezing, rhonchi or rales.  Abdominal:     General: Bowel sounds are normal. There is no distension.     Palpations: Abdomen is soft. There is no mass.     Tenderness: There is no abdominal tenderness.  Musculoskeletal:        General: No swelling.     Right lower leg: No edema.     Left lower leg: No edema.   Lymphadenopathy:     Cervical: No cervical adenopathy.     Upper Body:     Right upper body: No supraclavicular or axillary adenopathy.     Left upper body: No supraclavicular or axillary adenopathy.     Lower Body: No right inguinal adenopathy. No left inguinal adenopathy.  Skin:    General: Skin is warm.     Coloration: Skin is not jaundiced.     Findings: No lesion or rash.  Neurological:     General: No focal deficit present.     Mental Status: He is alert and oriented to person, place, and time. Mental status is at baseline.  Psychiatric:        Mood and Affect: Mood normal.        Behavior: Behavior normal.        Thought Content: Thought content normal.    LABS:      Latest Ref Rng & Units 08/08/2021   12:00 AM 02/07/2021   12:00 AM 08/09/2020   12:00 AM  CBC  WBC  4.6      3.5      2.5    Hemoglobin 13.5 - 17.5 13.0      14.5      14.6    Hematocrit 41 - 53 40      42  44    Platelets 150 - 400 K/uL 279      230      185       This result is from an external source.      Latest Ref Rng & Units 08/08/2021   12:00 AM 03/17/2021   11:20 AM 02/07/2021   12:00 AM  CMP  Glucose 70 - 99 mg/dL  100     BUN 4 - '21 19      18   14       '$ Creatinine 0.6 - 1.3 1.0      1.20   1.1       Sodium 137 - 147 140      141   141       Potassium 3.5 - 5.1 mEq/L 4.4      4.8   4.0       Chloride 99 - 108 102      102   107       CO2 13 - '22 29      25   26       '$ Calcium 8.7 - 10.7 9.3      9.7   8.9       Total Protein 6.0 - 8.5 g/dL  6.8     Total Bilirubin 0.0 - 1.2 mg/dL  0.8     Alkaline Phos 25 - 125 101      112   99       AST 14 - 40 '21      16   29       '$ ALT 10 - 40 U/L '21      24   26          '$ This result is from an external source.    ASSESSMENT & PLAN:  Assessment/Plan:  A 76 y.o. 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  Based upon his labs and physical exam today, the  patient remains disease-free.  Clinically, the patient appears to be doing very well.  I will see him back in 6 months for repeat clinical assessment. The patient understands all the plans discussed today and is in agreement with them.   Benetta Maclaren Macarthur Critchley, MD

## 2021-08-08 ENCOUNTER — Other Ambulatory Visit: Payer: Self-pay | Admitting: Oncology

## 2021-08-08 ENCOUNTER — Inpatient Hospital Stay: Payer: PPO

## 2021-08-08 ENCOUNTER — Other Ambulatory Visit: Payer: Self-pay | Admitting: Hematology and Oncology

## 2021-08-08 ENCOUNTER — Inpatient Hospital Stay: Payer: PPO | Attending: Oncology | Admitting: Oncology

## 2021-08-08 VITALS — BP 154/75 | HR 79 | Temp 97.6°F | Resp 18 | Ht 69.0 in | Wt 157.7 lb

## 2021-08-08 DIAGNOSIS — D649 Anemia, unspecified: Secondary | ICD-10-CM | POA: Diagnosis not present

## 2021-08-08 DIAGNOSIS — C099 Malignant neoplasm of tonsil, unspecified: Secondary | ICD-10-CM | POA: Diagnosis not present

## 2021-08-08 DIAGNOSIS — E782 Mixed hyperlipidemia: Secondary | ICD-10-CM | POA: Diagnosis not present

## 2021-08-08 LAB — HEPATIC FUNCTION PANEL
ALT: 21 U/L (ref 10–40)
AST: 21 (ref 14–40)
Alkaline Phosphatase: 101 (ref 25–125)
Bilirubin, Total: 0.5

## 2021-08-08 LAB — BASIC METABOLIC PANEL
BUN: 19 (ref 4–21)
CO2: 29 — AB (ref 13–22)
Chloride: 102 (ref 99–108)
Creatinine: 1 (ref 0.6–1.3)
Glucose: 122
Potassium: 4.4 mEq/L (ref 3.5–5.1)
Sodium: 140 (ref 137–147)

## 2021-08-08 LAB — CBC AND DIFFERENTIAL
HCT: 40 — AB (ref 41–53)
Hemoglobin: 13 — AB (ref 13.5–17.5)
Neutrophils Absolute: 3.17
Platelets: 279 10*3/uL (ref 150–400)
WBC: 4.6

## 2021-08-08 LAB — COMPREHENSIVE METABOLIC PANEL
Albumin: 3.9 (ref 3.5–5.0)
Calcium: 9.3 (ref 8.7–10.7)

## 2021-08-08 LAB — CBC: RBC: 4.33 (ref 3.87–5.11)

## 2021-08-13 DIAGNOSIS — M25511 Pain in right shoulder: Secondary | ICD-10-CM | POA: Diagnosis not present

## 2021-08-13 DIAGNOSIS — M19011 Primary osteoarthritis, right shoulder: Secondary | ICD-10-CM | POA: Diagnosis not present

## 2021-08-13 DIAGNOSIS — G8929 Other chronic pain: Secondary | ICD-10-CM | POA: Diagnosis not present

## 2021-08-20 DIAGNOSIS — M25511 Pain in right shoulder: Secondary | ICD-10-CM | POA: Diagnosis not present

## 2021-08-20 DIAGNOSIS — R937 Abnormal findings on diagnostic imaging of other parts of musculoskeletal system: Secondary | ICD-10-CM | POA: Diagnosis not present

## 2021-08-21 ENCOUNTER — Other Ambulatory Visit: Payer: Self-pay | Admitting: Hematology and Oncology

## 2021-08-22 ENCOUNTER — Other Ambulatory Visit: Payer: Self-pay | Admitting: Hematology and Oncology

## 2021-08-22 DIAGNOSIS — C099 Malignant neoplasm of tonsil, unspecified: Secondary | ICD-10-CM

## 2021-08-22 DIAGNOSIS — M25819 Other specified joint disorders, unspecified shoulder: Secondary | ICD-10-CM | POA: Diagnosis not present

## 2021-08-22 DIAGNOSIS — M25511 Pain in right shoulder: Secondary | ICD-10-CM | POA: Diagnosis not present

## 2021-08-22 DIAGNOSIS — G8929 Other chronic pain: Secondary | ICD-10-CM | POA: Diagnosis not present

## 2021-08-22 DIAGNOSIS — M19011 Primary osteoarthritis, right shoulder: Secondary | ICD-10-CM | POA: Diagnosis not present

## 2021-08-25 ENCOUNTER — Other Ambulatory Visit: Payer: Self-pay | Admitting: Hematology and Oncology

## 2021-08-25 DIAGNOSIS — C770 Secondary and unspecified malignant neoplasm of lymph nodes of head, face and neck: Secondary | ICD-10-CM

## 2021-08-25 DIAGNOSIS — C099 Malignant neoplasm of tonsil, unspecified: Secondary | ICD-10-CM

## 2021-09-04 DIAGNOSIS — I714 Abdominal aortic aneurysm, without rupture, unspecified: Secondary | ICD-10-CM | POA: Diagnosis not present

## 2021-09-04 DIAGNOSIS — C419 Malignant neoplasm of bone and articular cartilage, unspecified: Secondary | ICD-10-CM | POA: Diagnosis not present

## 2021-09-04 DIAGNOSIS — K8020 Calculus of gallbladder without cholecystitis without obstruction: Secondary | ICD-10-CM | POA: Diagnosis not present

## 2021-09-04 DIAGNOSIS — C770 Secondary and unspecified malignant neoplasm of lymph nodes of head, face and neck: Secondary | ICD-10-CM | POA: Diagnosis not present

## 2021-09-04 DIAGNOSIS — C099 Malignant neoplasm of tonsil, unspecified: Secondary | ICD-10-CM | POA: Diagnosis not present

## 2021-09-04 DIAGNOSIS — I700 Atherosclerosis of aorta: Secondary | ICD-10-CM | POA: Diagnosis not present

## 2021-09-04 DIAGNOSIS — R590 Localized enlarged lymph nodes: Secondary | ICD-10-CM | POA: Diagnosis not present

## 2021-09-04 DIAGNOSIS — C7951 Secondary malignant neoplasm of bone: Secondary | ICD-10-CM | POA: Diagnosis not present

## 2021-09-04 DIAGNOSIS — J432 Centrilobular emphysema: Secondary | ICD-10-CM | POA: Diagnosis not present

## 2021-09-05 ENCOUNTER — Telehealth: Payer: Self-pay | Admitting: Hematology and Oncology

## 2021-09-05 ENCOUNTER — Encounter: Payer: Self-pay | Admitting: Hematology and Oncology

## 2021-09-05 NOTE — Telephone Encounter (Signed)
Spoke with patients wife and notified her that the patient has been scheduled for his biopsy.  It is scheduled for July 5th, pt to arrive at 8:30 am. Procedure at 10 am.  She has been notified of instructions.

## 2021-09-10 DIAGNOSIS — C801 Malignant (primary) neoplasm, unspecified: Secondary | ICD-10-CM | POA: Diagnosis not present

## 2021-09-10 DIAGNOSIS — C773 Secondary and unspecified malignant neoplasm of axilla and upper limb lymph nodes: Secondary | ICD-10-CM | POA: Diagnosis not present

## 2021-09-10 DIAGNOSIS — C099 Malignant neoplasm of tonsil, unspecified: Secondary | ICD-10-CM | POA: Diagnosis not present

## 2021-09-10 DIAGNOSIS — R791 Abnormal coagulation profile: Secondary | ICD-10-CM | POA: Diagnosis not present

## 2021-09-10 DIAGNOSIS — R2231 Localized swelling, mass and lump, right upper limb: Secondary | ICD-10-CM | POA: Diagnosis not present

## 2021-09-11 ENCOUNTER — Telehealth: Payer: Self-pay | Admitting: Hematology and Oncology

## 2021-09-11 NOTE — Telephone Encounter (Signed)
09/11/21 spoke with patients wife and scheduled appt with Dr Bobby Rumpf

## 2021-09-14 NOTE — Progress Notes (Signed)
[facility]  [address] [place],  [place]  [address] [phone]  Clinic Day:  7/1 0/2023  Referring physician:  Collier Salina, NP  HISTORY OF PRESENT ILLNESS:  The patient is a 76 y.o. 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  His chemotherapy consisted of 3 cycles of cisplatin.  Unfortunately, recent scans showed an expansile lesion involving his right scapula.  Furthermore pathologic subpectoral and axillary lymphadenopathy was appreciated, as well as bilateral pulmonary nodules.  According to the patient, he has been having right shoulder pain for the past 2 months that was not improving with cortisone injections.  This led to an MRI of the area and CT scans being done, which unexpectedly showed some form of disease metastasis.  Just recently, the patient had his pathologic right axillary lymphadenopathy biopsied.  He comes in today to go over his biopsy and scan results, as well as the implications.  The patient remains in a significant amount of pain despite having received pain medications recently.  PHYSICAL EXAM:  Blood pressure (!) 163/77, pulse (!) 114, temperature 98.3 F (36.8 C), resp. rate 16, height '5\' 9"'$  (1.753 m), weight 156 lb 12.8 oz (71.1 kg), SpO2 94 %. Wt Readings from Last 3 Encounters:  09/15/21 156 lb 12.8 oz (71.1 kg)  08/08/21 157 lb 11.2 oz (71.5 kg)  03/17/21 155 lb (70.3 kg)   Body mass index is 23.16 kg/m. Performance status (ECOG): 1 - Symptomatic but completely ambulatory Physical Exam Constitutional:      Appearance: Normal appearance. He is not ill-appearing.  HENT:     Mouth/Throat:     Mouth: Mucous membranes are moist.     Pharynx: Oropharynx is clear. No oropharyngeal exudate or posterior oropharyngeal erythema.  Cardiovascular:     Rate and Rhythm: Normal rate and regular rhythm.     Heart  sounds: No murmur heard.    No friction rub. No gallop.  Pulmonary:     Effort: Pulmonary effort is normal. No respiratory distress.     Breath sounds: Normal breath sounds. No wheezing, rhonchi or rales.  Abdominal:     General: Bowel sounds are normal. There is no distension.     Palpations: Abdomen is soft. There is no mass.     Tenderness: There is no abdominal tenderness.  Musculoskeletal:        General: No swelling.     Right upper arm: Edema present.     Right forearm: Edema present.     Right lower leg: No edema.     Left lower leg: No edema.  Lymphadenopathy:     Cervical: No cervical adenopathy.     Upper Body:     Right upper body: Axillary adenopathy (hard, firm lymph node palpated) present. No supraclavicular adenopathy.     Left upper body: No supraclavicular or axillary adenopathy.     Lower Body: No right inguinal adenopathy. No left inguinal adenopathy.  Skin:    General: Skin is warm.     Coloration: Skin is not jaundiced.     Findings: No lesion or rash.  Neurological:     General: No focal deficit present.     Mental Status: He is alert and oriented to person, place, and time. Mental status is at baseline.  Psychiatric:        Mood and Affect: Mood normal.  Behavior: Behavior normal.        Thought Content: Thought content normal.    PATHOLOGY:  ASSESSMENT & PLAN:  Assessment/Plan:  A 76 y.o. male who unfortunately has metastatic squamous cell carcinoma involving his right scapula, right subpectoral and axillary lymph nodes, and bilateral lungs.  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  Although not impossible, I find it unlikely that his HPV positive tonsillar cancer is behind his disease metastasis.  Of note, this gentleman did smoke as much as 2 packs of cigarettes daily for 50 years, which concerns me that his  metastatic squamous cell carcinoma may be from a different origin.  With his prominent smoking history, my concern is this may all be metastatic lung cancer.  For completeness, I will have this gentleman undergo a PET scan to see where all of his metastatic disease is.  His PET scan may also elucidate where his primary site of disease is located.  This PET scan will be scheduled for tomorrow.  I will see him back the following day to go over his PET scan images, which will be used to formulate his next course of action.  As pertains to his right shoulder pain, I will prescribe him oxycodone 10 mg, which he can take every 6 hours as needed for breakthrough pain.  Although despondent, the patient and his wife understand all the plans discussed today and are in agreement with them.    Herberto Ledwell Macarthur Critchley, MD

## 2021-09-15 ENCOUNTER — Inpatient Hospital Stay: Payer: PPO | Attending: Oncology | Admitting: Oncology

## 2021-09-15 ENCOUNTER — Other Ambulatory Visit: Payer: Self-pay | Admitting: Oncology

## 2021-09-15 VITALS — BP 163/77 | HR 114 | Temp 98.3°F | Resp 16 | Ht 69.0 in | Wt 156.8 lb

## 2021-09-15 DIAGNOSIS — C099 Malignant neoplasm of tonsil, unspecified: Secondary | ICD-10-CM

## 2021-09-15 DIAGNOSIS — C7802 Secondary malignant neoplasm of left lung: Secondary | ICD-10-CM | POA: Insufficient documentation

## 2021-09-15 DIAGNOSIS — C7800 Secondary malignant neoplasm of unspecified lung: Secondary | ICD-10-CM

## 2021-09-15 DIAGNOSIS — C771 Secondary and unspecified malignant neoplasm of intrathoracic lymph nodes: Secondary | ICD-10-CM | POA: Insufficient documentation

## 2021-09-15 DIAGNOSIS — Z87891 Personal history of nicotine dependence: Secondary | ICD-10-CM | POA: Insufficient documentation

## 2021-09-15 DIAGNOSIS — C7951 Secondary malignant neoplasm of bone: Secondary | ICD-10-CM | POA: Insufficient documentation

## 2021-09-15 DIAGNOSIS — Z5112 Encounter for antineoplastic immunotherapy: Secondary | ICD-10-CM | POA: Insufficient documentation

## 2021-09-15 DIAGNOSIS — C801 Malignant (primary) neoplasm, unspecified: Secondary | ICD-10-CM | POA: Diagnosis not present

## 2021-09-15 DIAGNOSIS — Z79899 Other long term (current) drug therapy: Secondary | ICD-10-CM | POA: Insufficient documentation

## 2021-09-15 DIAGNOSIS — Z923 Personal history of irradiation: Secondary | ICD-10-CM | POA: Insufficient documentation

## 2021-09-15 DIAGNOSIS — G893 Neoplasm related pain (acute) (chronic): Secondary | ICD-10-CM | POA: Insufficient documentation

## 2021-09-15 DIAGNOSIS — C7801 Secondary malignant neoplasm of right lung: Secondary | ICD-10-CM | POA: Insufficient documentation

## 2021-09-15 MED ORDER — OXYCODONE HCL 10 MG PO TABS: ORAL_TABLET | ORAL | 0 refills | Status: DC

## 2021-09-16 DIAGNOSIS — C770 Secondary and unspecified malignant neoplasm of lymph nodes of head, face and neck: Secondary | ICD-10-CM | POA: Diagnosis not present

## 2021-09-16 DIAGNOSIS — C7951 Secondary malignant neoplasm of bone: Secondary | ICD-10-CM | POA: Diagnosis not present

## 2021-09-16 DIAGNOSIS — C099 Malignant neoplasm of tonsil, unspecified: Secondary | ICD-10-CM | POA: Diagnosis not present

## 2021-09-16 DIAGNOSIS — C7800 Secondary malignant neoplasm of unspecified lung: Secondary | ICD-10-CM | POA: Diagnosis not present

## 2021-09-16 DIAGNOSIS — K8020 Calculus of gallbladder without cholecystitis without obstruction: Secondary | ICD-10-CM | POA: Diagnosis not present

## 2021-09-17 ENCOUNTER — Other Ambulatory Visit: Payer: Self-pay | Admitting: Hematology and Oncology

## 2021-09-17 ENCOUNTER — Inpatient Hospital Stay: Payer: PPO | Admitting: Oncology

## 2021-09-17 ENCOUNTER — Encounter: Payer: Self-pay | Admitting: Oncology

## 2021-09-17 VITALS — BP 144/70 | HR 111 | Temp 97.6°F | Resp 16 | Ht 69.0 in | Wt 155.1 lb

## 2021-09-17 DIAGNOSIS — C801 Malignant (primary) neoplasm, unspecified: Secondary | ICD-10-CM

## 2021-09-17 DIAGNOSIS — C7800 Secondary malignant neoplasm of unspecified lung: Secondary | ICD-10-CM

## 2021-09-17 MED ORDER — OXYCODONE HCL ER 20 MG PO T12A: 20.0000 mg | EXTENDED_RELEASE_TABLET | Freq: Two times a day (BID) | ORAL | 0 refills | Status: DC

## 2021-09-17 MED ORDER — MORPHINE SULFATE ER 15 MG PO TBCR: 15.0000 mg | EXTENDED_RELEASE_TABLET | Freq: Two times a day (BID) | ORAL | 0 refills | Status: DC

## 2021-09-17 NOTE — Progress Notes (Signed)
START ON PATHWAY REGIMEN - Head and Neck     A cycle is every 21 days:     Carboplatin      Fluorouracil      Pembrolizumab   **Always confirm dose/schedule in your pharmacy ordering system**  Patient Characteristics: Oropharynx, HPV Positive, Metastatic, First Line, No Prior Platinum-Based Chemoradiation within 6 Months, PD-L1 Expression Negative (CPS < 1) / Unknown Disease Classification: Oropharynx HPV Status: Positive (+) Therapeutic Status: Metastatic Disease Line of Therapy: First Line Prior Platinum Status: No Prior Platinum-Based Chemoradiation within 6 Months PD-L1 Expression Status: Awaiting Test Results Intent of Therapy: Non-Curative / Palliative Intent, Discussed with Patient

## 2021-09-17 NOTE — Progress Notes (Signed)
[place]  [address] [place],  [place]  [address] [phone]  Clinic Day:  09/17/2021  Referring physician:  Collier Salina, NP  HISTORY OF PRESENT ILLNESS:  The patient is a 76 y.o. male whose recent scans unexpectedly showed metastatic disease involving his right scapula.  Furthermore pathologic subpectoral/axillary lymphadenopathy and  bilateral pulmonary nodules were seen.  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  As his metastatic disease is out of character with HPV positive tonsillar cancer, the patient underwent a PET scan yesterday to determine a potential origin of his disease, as well as if there are other occult sites of disease metastasis.  He comes in today to go over these images and their implications.  Since his last visit, the patient continues to have pain in his right scapular region.  This is despite him being prescribed oxycodone 10 mg every few hours.  Understandably, he is frustrated with knowing that he has metastatic, incurable disease.   PHYSICAL EXAM:  Blood pressure (!) 144/70, pulse (!) 111, temperature 97.6 F (36.4 C), resp. rate 16, height '5\' 9"'$  (1.753 m), weight 155 lb 1.6 oz (70.4 kg), SpO2 91 %. Wt Readings from Last 3 Encounters:  09/17/21 155 lb 1.6 oz (70.4 kg)  09/15/21 156 lb 12.8 oz (71.1 kg)  08/08/21 157 lb 11.2 oz (71.5 kg)   Body mass index is 22.9 kg/m. Performance status (ECOG): 1 - Symptomatic but completely ambulatory Physical Exam Constitutional:      Appearance: Normal appearance. He is not ill-appearing.  HENT:     Mouth/Throat:     Mouth: Mucous membranes are moist.     Pharynx: Oropharynx is clear. No oropharyngeal exudate or posterior oropharyngeal erythema.  Cardiovascular:     Rate and Rhythm: Normal rate and regular rhythm.     Heart sounds: No murmur heard.    No friction  rub. No gallop.  Pulmonary:     Effort: Pulmonary effort is normal. No respiratory distress.     Breath sounds: Normal breath sounds. No wheezing, rhonchi or rales.  Abdominal:     General: Bowel sounds are normal. There is no distension.     Palpations: Abdomen is soft. There is no mass.     Tenderness: There is no abdominal tenderness.  Musculoskeletal:        General: No swelling.     Right upper arm: Edema present.     Right forearm: Edema present.     Right lower leg: No edema.     Left lower leg: No edema.  Lymphadenopathy:     Cervical: No cervical adenopathy.     Upper Body:     Right upper body: Axillary adenopathy (hard, firm lymph node palpated) present. No supraclavicular adenopathy.     Left upper body: No supraclavicular or axillary adenopathy.     Lower Body: No right inguinal adenopathy. No left inguinal adenopathy.  Skin:    General: Skin is warm.     Coloration: Skin is not jaundiced.     Findings: No lesion or rash.  Neurological:     General: No focal deficit present.     Mental Status: He is alert and oriented to person, place, and time. Mental status is at baseline.  Psychiatric:        Mood and Affect: Mood normal.  Behavior: Behavior normal.        Thought Content: Thought content normal.   SCANS: His PET scan revealed the following: FINDINGS: Mediastinal blood pool activity: SUV max 2.89  Liver activity: SUV max NA  NECK: Symmetric uptake in the region of the arytenoids and true cords likely due to phonation. No recurrent hypermetabolic neck mass or cervical lymphadenopathy.  Incidental CT findings: none  CHEST: Numerous hypermetabolic metastatic pulmonary nodules as demonstrated on the recent CT scan.  Irregular right upper lobe pulmonary lesion measuring 2 cm has an SUV max of 7.03.  Lower right upper lobe pulmonary nodule measures 13 mm and SUV max is 6.32.  11 mm left lower lobe nodule has an SUV max of 6.30.  13 mm right lower  lobe nodule has an SUV max of 6.70. Several other smaller hypermetabolic nodules are noted.  Extensive right supraclavicular and right axillary lymphadenopathy. The large nodal mass in the right axilla measures approximately 5.4 cm and the SUV max is 9.88. There are also hypermetabolic right supraclavicular and subpectoral nodes.  No enlarged or hypermetabolic mediastinal hilar lymph nodes.  Incidental CT findings: Stable advanced aortic and three-vessel coronary artery calcifications. Stable underlying emphysematous changes and pulmonary scarring.  ABDOMEN/PELVIS: No abnormal hypermetabolic activity within the liver, pancreas, adrenal glands, or spleen. No hypermetabolic lymph nodes in the abdomen or pelvis.  Incidental CT findings: Cholelithiasis. Aortoiliac stent graft. Underlying advanced vascular disease. Thick-walled bladder may be due to lack of distension. Stable sigmoid Gianny diverticulosis.  SKELETON: Large destructive lesion involving the right glenoid demonstrates marked hypermetabolism with SUV max of 16.37. Two small hypermetabolic focus in the L3 vertebral body has an SUV max of 3.23  Hypermetabolic lesion in the right upper femoral shaft has an SUV max of 9.94. Small pelvic bone lesions are also noted.  Incidental CT findings: none  IMPRESSION: 1. Several hypermetabolic pulmonary metastatic nodules. 2. Metastatic bone disease. Large destructive lesion involving the right glenoid with associated significant hypermetabolic right axillary lymphadenopathy. There is also right supraclavicular adenopathy. Other lesions noted in the sternum, lumbar spine and right proximal femur. 3. No findings suspicious for a local neck recurrence. 4. No findings for abdominal/pelvic metastatic disease. 5. Stable additional findings including advanced vascular disease, aortoiliac stent graft, cholelithiasis and diverticulosis.  PATHOLOGY:  ASSESSMENT & PLAN:  Assessment/Plan:   A 76 y.o. male who unfortunately has metastatic squamous cell carcinoma involving his right scapula, right subpectoral and axillary lymph nodes, and bilateral lungs.  In clinic today, I went over his PET scan images with him, for which he could see that he also has evidence of metastatic bone disease.  Unfortunately, the PET scan really did not elucidate where his metastatic disease may have originated.  Although he has a history of stage I HPV positive tonsillar cancer, I have a very hard time believing all of his metastatic disease emanated from this previous lesion.  Nevertheless, as both his tonsillar cancer and his current cancer are squamous cell in origin, I will give him a squamous cell carcinoma regimen that will hopefully bring his disease burden under some better some semblance of control.  I will place this gentleman on carboplatin/infusional 5-fluorouracil/pembrolizumab.  Carboplatin and pembrolizumab will be given on day 1; infusional 5-fluorouracil will be given on days 1 through 4.  He will receive 6 total cycles of his chemotherapy/immunotherapy regimen, with each cycle being repeated every 3 weeks.  The patient was made aware of the side effects that go along with this  regimen, including mild nausea, fatigue, cytopenias, diarrhea, and shortness of breath.  I will arrange for his first cycle of this regimen to be given on Monday, July 24th.  As this gentleman does have excruciating back pain from where his disease has impacted his right scapula, I will have him see radiation oncology to undergo palliative radiation to this region.  I will also prescribe this gentleman oxycodone extended release 20 mg twice daily.  He knows to continue taking oxycodone immediate release 10 mg every 4-6 hours for breakthrough pain.  I will see this patient back in approximately 1 month before he heads into his second cycle of carboplatin/infusional 5-fluorouracil/pembrolizumab.  The patient understands all the plans  discussed today and is in agreement with them.   Rami Waddle Macarthur Critchley, MD

## 2021-09-18 DIAGNOSIS — C7800 Secondary malignant neoplasm of unspecified lung: Secondary | ICD-10-CM | POA: Diagnosis not present

## 2021-09-18 DIAGNOSIS — I739 Peripheral vascular disease, unspecified: Secondary | ICD-10-CM | POA: Diagnosis not present

## 2021-09-18 DIAGNOSIS — Z85818 Personal history of malignant neoplasm of other sites of lip, oral cavity, and pharynx: Secondary | ICD-10-CM | POA: Diagnosis not present

## 2021-09-18 DIAGNOSIS — Z6822 Body mass index (BMI) 22.0-22.9, adult: Secondary | ICD-10-CM | POA: Diagnosis not present

## 2021-09-18 DIAGNOSIS — E782 Mixed hyperlipidemia: Secondary | ICD-10-CM | POA: Diagnosis not present

## 2021-09-18 DIAGNOSIS — I700 Atherosclerosis of aorta: Secondary | ICD-10-CM | POA: Diagnosis not present

## 2021-09-18 DIAGNOSIS — J439 Emphysema, unspecified: Secondary | ICD-10-CM | POA: Diagnosis not present

## 2021-09-18 DIAGNOSIS — K219 Gastro-esophageal reflux disease without esophagitis: Secondary | ICD-10-CM | POA: Diagnosis not present

## 2021-09-18 DIAGNOSIS — R7303 Prediabetes: Secondary | ICD-10-CM | POA: Diagnosis not present

## 2021-09-19 ENCOUNTER — Encounter: Payer: Self-pay | Admitting: Hematology and Oncology

## 2021-09-22 DIAGNOSIS — C773 Secondary and unspecified malignant neoplasm of axilla and upper limb lymph nodes: Secondary | ICD-10-CM | POA: Diagnosis not present

## 2021-09-22 DIAGNOSIS — C090 Malignant neoplasm of tonsillar fossa: Secondary | ICD-10-CM | POA: Diagnosis not present

## 2021-09-22 DIAGNOSIS — C7951 Secondary malignant neoplasm of bone: Secondary | ICD-10-CM | POA: Diagnosis not present

## 2021-09-22 DIAGNOSIS — C099 Malignant neoplasm of tonsil, unspecified: Secondary | ICD-10-CM | POA: Diagnosis not present

## 2021-09-23 DIAGNOSIS — C773 Secondary and unspecified malignant neoplasm of axilla and upper limb lymph nodes: Secondary | ICD-10-CM | POA: Diagnosis not present

## 2021-09-23 DIAGNOSIS — C7951 Secondary malignant neoplasm of bone: Secondary | ICD-10-CM | POA: Diagnosis not present

## 2021-09-23 DIAGNOSIS — Z510 Encounter for antineoplastic radiation therapy: Secondary | ICD-10-CM | POA: Diagnosis not present

## 2021-09-23 DIAGNOSIS — C090 Malignant neoplasm of tonsillar fossa: Secondary | ICD-10-CM | POA: Diagnosis not present

## 2021-09-25 DIAGNOSIS — Z452 Encounter for adjustment and management of vascular access device: Secondary | ICD-10-CM | POA: Diagnosis not present

## 2021-09-25 DIAGNOSIS — C7951 Secondary malignant neoplasm of bone: Secondary | ICD-10-CM | POA: Diagnosis not present

## 2021-09-25 DIAGNOSIS — C773 Secondary and unspecified malignant neoplasm of axilla and upper limb lymph nodes: Secondary | ICD-10-CM | POA: Diagnosis not present

## 2021-09-25 DIAGNOSIS — Z87891 Personal history of nicotine dependence: Secondary | ICD-10-CM | POA: Diagnosis not present

## 2021-09-25 DIAGNOSIS — I2510 Atherosclerotic heart disease of native coronary artery without angina pectoris: Secondary | ICD-10-CM | POA: Diagnosis not present

## 2021-09-25 DIAGNOSIS — Z510 Encounter for antineoplastic radiation therapy: Secondary | ICD-10-CM | POA: Diagnosis not present

## 2021-09-25 DIAGNOSIS — I10 Essential (primary) hypertension: Secondary | ICD-10-CM | POA: Diagnosis not present

## 2021-09-25 DIAGNOSIS — C799 Secondary malignant neoplasm of unspecified site: Secondary | ICD-10-CM | POA: Diagnosis not present

## 2021-09-25 DIAGNOSIS — C090 Malignant neoplasm of tonsillar fossa: Secondary | ICD-10-CM | POA: Diagnosis not present

## 2021-09-25 DIAGNOSIS — R918 Other nonspecific abnormal finding of lung field: Secondary | ICD-10-CM | POA: Diagnosis not present

## 2021-09-25 DIAGNOSIS — C099 Malignant neoplasm of tonsil, unspecified: Secondary | ICD-10-CM | POA: Diagnosis not present

## 2021-09-25 DIAGNOSIS — J929 Pleural plaque without asbestos: Secondary | ICD-10-CM | POA: Diagnosis not present

## 2021-09-26 ENCOUNTER — Other Ambulatory Visit: Payer: Self-pay | Admitting: Hematology and Oncology

## 2021-09-26 ENCOUNTER — Other Ambulatory Visit: Payer: Self-pay | Admitting: Oncology

## 2021-09-26 MED ORDER — ONDANSETRON HCL 4 MG PO TABS: 4.0000 mg | ORAL_TABLET | ORAL | 2 refills | Status: AC | PRN

## 2021-09-26 MED ORDER — PROCHLORPERAZINE MALEATE 10 MG PO TABS: 10.0000 mg | ORAL_TABLET | Freq: Four times a day (QID) | ORAL | 2 refills | Status: AC | PRN

## 2021-09-29 ENCOUNTER — Other Ambulatory Visit: Payer: Self-pay | Admitting: Oncology

## 2021-09-29 ENCOUNTER — Other Ambulatory Visit: Payer: Self-pay

## 2021-09-30 ENCOUNTER — Other Ambulatory Visit: Payer: Self-pay | Admitting: Pharmacist

## 2021-09-30 ENCOUNTER — Inpatient Hospital Stay: Payer: PPO

## 2021-09-30 ENCOUNTER — Encounter: Payer: Self-pay | Admitting: Hematology and Oncology

## 2021-09-30 ENCOUNTER — Inpatient Hospital Stay (INDEPENDENT_AMBULATORY_CARE_PROVIDER_SITE_OTHER): Payer: PPO | Admitting: Hematology and Oncology

## 2021-09-30 VITALS — BP 124/63 | HR 126 | Temp 97.7°F | Resp 20 | Ht 69.0 in | Wt 159.6 lb

## 2021-09-30 DIAGNOSIS — C099 Malignant neoplasm of tonsil, unspecified: Secondary | ICD-10-CM | POA: Diagnosis not present

## 2021-09-30 DIAGNOSIS — Z87891 Personal history of nicotine dependence: Secondary | ICD-10-CM | POA: Diagnosis not present

## 2021-09-30 DIAGNOSIS — C801 Malignant (primary) neoplasm, unspecified: Secondary | ICD-10-CM | POA: Diagnosis not present

## 2021-09-30 DIAGNOSIS — C7800 Secondary malignant neoplasm of unspecified lung: Secondary | ICD-10-CM

## 2021-09-30 DIAGNOSIS — Z79899 Other long term (current) drug therapy: Secondary | ICD-10-CM | POA: Diagnosis not present

## 2021-09-30 DIAGNOSIS — C7801 Secondary malignant neoplasm of right lung: Secondary | ICD-10-CM | POA: Diagnosis not present

## 2021-09-30 DIAGNOSIS — Z5112 Encounter for antineoplastic immunotherapy: Secondary | ICD-10-CM | POA: Diagnosis not present

## 2021-09-30 DIAGNOSIS — G893 Neoplasm related pain (acute) (chronic): Secondary | ICD-10-CM | POA: Diagnosis not present

## 2021-09-30 DIAGNOSIS — Z923 Personal history of irradiation: Secondary | ICD-10-CM | POA: Diagnosis not present

## 2021-09-30 DIAGNOSIS — C771 Secondary and unspecified malignant neoplasm of intrathoracic lymph nodes: Secondary | ICD-10-CM | POA: Diagnosis not present

## 2021-09-30 DIAGNOSIS — C7951 Secondary malignant neoplasm of bone: Secondary | ICD-10-CM | POA: Diagnosis not present

## 2021-09-30 DIAGNOSIS — C7802 Secondary malignant neoplasm of left lung: Secondary | ICD-10-CM | POA: Diagnosis not present

## 2021-09-30 LAB — TSH: TSH: 4.009 u[IU]/mL (ref 0.350–4.500)

## 2021-09-30 NOTE — Progress Notes (Signed)
[facility] CONSULT NOTE  Patient Care Team: Darrol Jump, PA-C as PCP - General (Family Medicine) Revankar, Reita Cliche, MD as PCP - Cardiology (Cardiology) Cletis Athens, MD as Referring Physician (Otolaryngology) Marice Potter, MD as Consulting Physician (Oncology) Gatha Mayer, MD as Consulting Physician (Radiation Oncology)   Name of the patient: Cody Benjamin  [id number]  [birth year]   Date of visit: 09/30/21  Diagnosis- Metastatic squamous cell carcinoma  Chief complaint/Reason for visit- Initial Meeting for [facility], preparing for starting chemotherapy    Heme/Onc history:  Oncology History  Metastatic squamous cell carcinoma involving lung with unknown primary site [facility])  09/15/2021 Initial Diagnosis   Metastatic squamous cell carcinoma involving lung with unknown primary site [facility])   10/06/2021 -  Chemotherapy   Patient is on Treatment Plan : HEAD/NECK Pembrolizumab (200) D1 + Carboplatin (5) D1 + 5FU (1000) IVCI D1-4 q21d x 6 cycles / Pembrolizumab (200) D1 q21d       Interval history-  Patient presents to chemo care clinic today for initial meeting in preparation for starting chemotherapy. I introduced the chemo care clinic and we discussed that the role of the clinic is to assist those who are at an increased risk of emergency room visits and/or complications during the course of chemotherapy treatment. We discussed that the increased risk takes into account factors such as 76, performance status, and co-morbidities. We also discussed that for some, this might include barriers to care such as not having a primary care provider, lack of insurance/transportation, or not being able to afford medications. We discussed that the goal of the program is to help prevent unplanned ER visits and help reduce complications during chemotherapy. We do this by discussing specific risk factors to each individual and identifying ways that we can help improve these risk  factors and reduce barriers to care.   Allergies  Allergen Reactions   Levofloxacin Nausea And Vomiting    Other reaction(s): Chills (intolerance)    Past Medical History:  Diagnosis Date   AAA (abdominal aortic aneurysm) 12/27/2017   Abdominal aortic aneurysm (AAA) without rupture 12/15/2017   Admission for fitting and adjustment of vascular catheter 12/15/2017   Arthritis    right shoulder   Atherosclerotic vascular disease 07/26/2020   BPH (benign prostatic hyperplasia)    Chronic kidney disease    history of kidney stones   Drug therapy    Elevated blood sugar    Ex-cigarette smoker 12/15/2017   Fever    Gastrostomy in place [facility]) 10/31/2018   H/O endovascular stent graft for abdominal aortic aneurysm 07/26/2020   History of kidney stones    Lipids abnormal    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 ([place]) 05/30/2018   Mixed dyslipidemia 12/08/2018   Pneumonia    Postoperative examination 06/13/2018   Tonsil cancer ([place]) 05/30/2018    Past Surgical History:  Procedure Laterality Date   ABDOMINAL AORTIC ENDOVASCULAR STENT GRAFT N/A 12/27/2017   Procedure: ABDOMINAL AORTIC ENDOVASCULAR STENT GRAFT;  Surgeon: Elam Dutch, MD;  Location: [facility] OR;  Service: Vascular;  Laterality: N/A;   COLONOSCOPY     SHOULDER SURGERY Right 1969    Social History   Socioeconomic History   Marital status: Married    Spouse name: Not on file   Number of children: Not on file   Years of education: Not on file   Highest education level: Not on file  Occupational History   Not on file  Tobacco Use  Smoking status: Former    Types: Cigarettes    Quit date: 03/09/2013    Years since quitting: 8.5   Smokeless tobacco: Never  Vaping Use   Vaping Use: Every day  Substance and Sexual Activity   Alcohol use: Never   Drug use: Never   Sexual activity: Not on file  Other Topics Concern   Not on file  Social History Narrative   Not on file   Social Determinants of Health   Financial  Resource Strain: Not on file  Food Insecurity: Not on file  Transportation Needs: Not on file  Physical Activity: Not on file  Stress: Not on file  Social Connections: Not on file  Intimate Partner Violence: Not on file    Family History  Problem Relation Age of Onset   Muzammil cancer Mother    Seizures Brother      Current Outpatient Medications:    pantoprazole (PROTONIX) 40 MG tablet, Take 1 tablet by mouth daily., Disp: , Rfl:    aspirin EC 81 MG tablet, Take 81 mg by mouth daily., Disp: , Rfl:    atorvastatin (LIPITOR) 80 MG tablet, TAKE 1 TABLET(80 MG) BY MOUTH DAILY, Disp: 90 tablet, Rfl: 3   celecoxib (CELEBREX) 200 MG capsule, Take by mouth., Disp: , Rfl:    cetirizine (ZYRTEC) 10 MG tablet, Take 10 mg by mouth daily., Disp: , Rfl:    cloNIDine (CATAPRES) 0.1 MG tablet, Take 1 tablet by mouth once daily if blood pressue is > 150/95, Disp: , Rfl:    dexamethasone (DECADRON) 4 MG tablet, Take by mouth., Disp: , Rfl:    diclofenac Sodium (VOLTAREN) 1 % GEL, Apply 1 application. topically daily., Disp: , Rfl:    ezetimibe (ZETIA) 10 MG tablet, Take 1 tablet (10 mg total) by mouth daily., Disp: 90 tablet, Rfl: 3   meloxicam (MOBIC) 15 MG tablet, Take 15 mg by mouth daily., Disp: , Rfl:    methocarbamol (ROBAXIN) 500 MG tablet, Take by mouth., Disp: , Rfl:    morphine (MS CONTIN) 15 MG 12 hr tablet, Take by mouth., Disp: , Rfl:    naproxen sodium (ANAPROX) 550 MG tablet, Take by mouth., Disp: , Rfl:    ondansetron (ZOFRAN) 4 MG tablet, Take 1 tablet (4 mg total) by mouth every 4 (four) hours as needed for nausea or vomiting., Disp: 30 tablet, Rfl: 2   oxyCODONE (OXYCONTIN) 20 mg 12 hr tablet, Take 1 tablet (20 mg total) by mouth every 12 (twelve) hours., Disp: 14 tablet, Rfl: 0   prochlorperazine (COMPAZINE) 10 MG tablet, Take 1 tablet (10 mg total) by mouth every 6 (six) hours as needed for nausea or vomiting., Disp: 30 tablet, Rfl: 2   traMADol (ULTRAM) 50 MG tablet, Take 1 tablet  by mouth every 6 (six) hours as needed., Disp: , Rfl:      Latest Ref Rng & Units 08/08/2021   12:00 AM  CMP  BUN 4 - 21 19      Creatinine 0.6 - 1.3 1.0      Sodium 137 - 147 140      Potassium 3.5 - 5.1 mEq/L 4.4      Chloride 99 - 108 102      CO2 13 - 22 29      Calcium 8.7 - 10.7 9.3      Alkaline Phos 25 - 125 101      AST 14 - 40 21      ALT 10 -  40 U/L 21         This result is from an external source.      Latest Ref Rng & Units 08/08/2021   12:00 AM  CBC  WBC  4.6      Hemoglobin 13.5 - 17.5 13.0      Hematocrit 41 - 53 40      Platelets 150 - 400 K/uL 279         This result is from an external source.    No images are attached to the encounter.  No results found.   Assessment and plan- Patient is a 76 y.o. male who presents to [facility] for initial meeting in preparation for starting chemotherapy for the treatment of metastatic squamous cell carcinoma.   Chemo Care Clinic/High Risk for ER/Hospitalization during chemotherapy- We discussed the role of the chemo care clinic and identified patient specific risk factors. I discussed that patient was identified as high risk primarily based on:  Patient has past medical history positive for: Past Medical History:  Diagnosis Date   AAA (abdominal aortic aneurysm) 12/27/2017   Abdominal aortic aneurysm (AAA) without rupture 12/15/2017   Admission for fitting and adjustment of vascular catheter 12/15/2017   Arthritis    right shoulder   Atherosclerotic vascular disease 07/26/2020   BPH (benign prostatic hyperplasia)    Chronic kidney disease    history of kidney stones   Drug therapy    Elevated blood sugar    Ex-cigarette smoker 12/15/2017   Fever    Gastrostomy in place [facility]) 10/31/2018   H/O endovascular stent graft for abdominal aortic aneurysm 07/26/2020   History of kidney stones    Lipids abnormal    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 ([place]) 05/30/2018   Mixed dyslipidemia 12/08/2018   Pneumonia     Postoperative examination 06/13/2018   Tonsil cancer ([place]) 05/30/2018    Patient has past surgical history positive for: Past Surgical History:  Procedure Laterality Date   ABDOMINAL AORTIC ENDOVASCULAR STENT GRAFT N/A 12/27/2017   Procedure: ABDOMINAL AORTIC ENDOVASCULAR STENT GRAFT;  Surgeon: Elam Dutch, MD;  [facility];  Service: Vascular;  Laterality: N/A;   COLONOSCOPY     SHOULDER SURGERY Right 1969     We discussed that social determinants of health may have significant impacts on health and outcomes for cancer patients.  Today we discussed specific social determinants of performance status, alcohol use, depression, financial needs, food insecurity, housing, interpersonal violence, social connections, stress, tobacco use, and transportation.    After lengthy discussion the following were identified as areas of need:   Outpatient services: We discussed options including home based and outpatient services, DME and care program. We discusssed that patients who participate in regular physical activity report fewer negative impacts of cancer and treatments and report less fatigue.   Financial Concerns: We discussed that living with cancer can create tremendous financial burden.  We discussed options for assistance. I asked that if assistance is needed in affording medications or paying bills to please let us know so that we can provide assistance. We discussed options for food including social services and onsite food pantry.  We will also notify Mort Sawyers to see if cancer center can provide additional support.  Referral to Social work: Introduced Education officer, museum Mort Sawyers and the services she can provide such as support with utility bill, cell phone and gas vouchers.   Support groups: We discussed options for support groups at the  cancer center. If interested, please notify nurse navigator to enroll. We discussed options for managing stress including healthy eating,  exercise as well as participating in no charge counseling services at the cancer center and support groups.  If these are of interest, patient can notify either myself or primary nursing team.We discussed options for management including medications and referral to quit Smart program  Transportation: We discussed options for transportation including [employer], paratransit, bus routes, link transit, taxi/uber/lyft, and cancer center van.  I have notified primary oncology team who will help assist with arranging Lucianne Lei transportation for appointments when/if needed. We also discussed options for transportation on short notice/acute visits.  Palliative care services: We have palliative care services available in the cancer center to discuss goals of care and advanced care planning.  Please let us know if you have any questions or would like to speak to our palliative nurse practitioner.  Symptom Management Clinic: We discussed our symptom management clinic which is available for acute concerns while receiving treatment such as nausea, vomiting or diarrhea.  We can be reached via telephone at [phone] or through my chart.  We are available for virtual or in person visits on the same day from 830 to 4 PM Monday through Friday. He denies needing specific assistance at this time and He will be followed by Dr. Bobby Rumpf clinical team.  Plan: Discussed symptom management clinic. Discussed palliative care services. Discussed resources that are available here at the cancer center. Discussed medications and new prescriptions to begin treatment such as anti-nausea or steroids.   Disposition: RTC on   Visit Diagnosis No diagnosis found.  Patient expressed understanding and was in agreement with this plan. He also understands that He can call clinic at any time with any questions, concerns, or complaints.   I provided 30 minutes of  face to face  during this encounter, and > 50% was spent counseling as documented  under my assessment & plan.   Dayton Scrape, FNP-BC

## 2021-10-01 ENCOUNTER — Other Ambulatory Visit: Payer: Self-pay | Admitting: Hematology and Oncology

## 2021-10-01 ENCOUNTER — Other Ambulatory Visit: Payer: PPO

## 2021-10-01 DIAGNOSIS — C090 Malignant neoplasm of tonsillar fossa: Secondary | ICD-10-CM | POA: Diagnosis not present

## 2021-10-01 DIAGNOSIS — Z510 Encounter for antineoplastic radiation therapy: Secondary | ICD-10-CM | POA: Diagnosis not present

## 2021-10-01 DIAGNOSIS — C7951 Secondary malignant neoplasm of bone: Secondary | ICD-10-CM | POA: Diagnosis not present

## 2021-10-02 ENCOUNTER — Other Ambulatory Visit: Payer: PPO

## 2021-10-02 ENCOUNTER — Other Ambulatory Visit: Payer: Self-pay | Admitting: Hematology and Oncology

## 2021-10-02 ENCOUNTER — Ambulatory Visit: Payer: PPO

## 2021-10-02 ENCOUNTER — Encounter: Payer: Self-pay | Admitting: Oncology

## 2021-10-02 DIAGNOSIS — C7800 Secondary malignant neoplasm of unspecified lung: Secondary | ICD-10-CM

## 2021-10-02 LAB — T4: T4, Total: 7.5 ug/dL (ref 4.5–12.0)

## 2021-10-02 MED ORDER — METHADONE HCL 5 MG PO TABS: 5.0000 mg | ORAL_TABLET | Freq: Two times a day (BID) | ORAL | 0 refills | Status: DC

## 2021-10-02 NOTE — Progress Notes (Signed)
Oxycodone '20mg'$  ER replaced by Morphine '15mg'$  ER.

## 2021-10-03 ENCOUNTER — Encounter: Payer: Self-pay | Admitting: Oncology

## 2021-10-03 MED FILL — Fosaprepitant Dimeglumine For IV Infusion 150 MG (Base Eq): INTRAVENOUS | Qty: 5 | Status: AC

## 2021-10-03 MED FILL — Carboplatin IV Soln 600 MG/60ML: INTRAVENOUS | Qty: 45 | Status: AC

## 2021-10-03 MED FILL — Fluorouracil IV Soln 5 GM/100ML (50 MG/ML): INTRAVENOUS | Qty: 148 | Status: AC

## 2021-10-03 MED FILL — Pembrolizumab IV Soln 100 MG/4ML (25 MG/ML): INTRAVENOUS | Qty: 8 | Status: AC

## 2021-10-03 MED FILL — Dexamethasone Sodium Phosphate Inj 100 MG/10ML: INTRAMUSCULAR | Qty: 1 | Status: AC

## 2021-10-06 ENCOUNTER — Ambulatory Visit: Payer: PPO

## 2021-10-06 ENCOUNTER — Inpatient Hospital Stay: Payer: PPO

## 2021-10-06 ENCOUNTER — Other Ambulatory Visit: Payer: Self-pay | Admitting: Hematology and Oncology

## 2021-10-06 VITALS — BP 122/67 | HR 91 | Temp 98.0°F | Resp 18 | Ht 69.0 in | Wt 157.0 lb

## 2021-10-06 DIAGNOSIS — C099 Malignant neoplasm of tonsil, unspecified: Secondary | ICD-10-CM | POA: Diagnosis not present

## 2021-10-06 DIAGNOSIS — C801 Malignant (primary) neoplasm, unspecified: Secondary | ICD-10-CM

## 2021-10-06 DIAGNOSIS — Z5112 Encounter for antineoplastic immunotherapy: Secondary | ICD-10-CM | POA: Diagnosis not present

## 2021-10-06 MED ORDER — PALONOSETRON HCL INJECTION 0.25 MG/5ML
0.2500 mg | Freq: Once | INTRAVENOUS | Status: AC
  Administered 2021-10-06: 0.25 mg via INTRAVENOUS
  Filled 2021-10-06: qty 5

## 2021-10-06 MED ORDER — HEPARIN SOD (PORK) LOCK FLUSH 100 UNIT/ML IV SOLN: 500.0000 [IU] | Freq: Once | INTRAVENOUS | Status: DC | PRN

## 2021-10-06 MED ORDER — SODIUM CHLORIDE 0.9 % IV SOLN
1000.0000 mg/m2/d | INTRAVENOUS | Status: DC
  Administered 2021-10-06: 7400 mg via INTRAVENOUS
  Filled 2021-10-06: qty 148

## 2021-10-06 MED ORDER — SODIUM CHLORIDE 0.9 % IV SOLN: Freq: Once | INTRAVENOUS | Status: AC

## 2021-10-06 MED ORDER — SODIUM CHLORIDE 0.9 % IV SOLN
200.0000 mg | Freq: Once | INTRAVENOUS | Status: AC
  Administered 2021-10-06: 200 mg via INTRAVENOUS
  Filled 2021-10-06: qty 8

## 2021-10-06 MED ORDER — SODIUM CHLORIDE 0.9% FLUSH: 10.0000 mL | INTRAVENOUS | Status: DC | PRN

## 2021-10-06 MED ORDER — SODIUM CHLORIDE 0.9 % IV SOLN
150.0000 mg | Freq: Once | INTRAVENOUS | Status: AC
  Administered 2021-10-06: 150 mg via INTRAVENOUS
  Filled 2021-10-06: qty 150

## 2021-10-06 MED ORDER — SODIUM CHLORIDE 0.9 % IV SOLN
447.5000 mg | Freq: Once | INTRAVENOUS | Status: AC
  Administered 2021-10-06: 450 mg via INTRAVENOUS
  Filled 2021-10-06: qty 45

## 2021-10-06 MED ORDER — SODIUM CHLORIDE 0.9 % IV SOLN
10.0000 mg | Freq: Once | INTRAVENOUS | Status: AC
  Administered 2021-10-06: 10 mg via INTRAVENOUS
  Filled 2021-10-06: qty 10

## 2021-10-06 MED ORDER — OXYCODONE HCL 10 MG PO TABS: 10.0000 mg | ORAL_TABLET | ORAL | 0 refills | Status: AC | PRN

## 2021-10-06 NOTE — Patient Instructions (Signed)
Fluorouracil, 5-FU injection What is this medication? FLUOROURACIL, 5-FU (flure oh YOOR a sil) is a chemotherapy drug. It slows the growth of cancer cells. This medicine is used to treat many types of cancer like breast cancer, Mujahid or rectal cancer, pancreatic cancer, and stomach cancer. This medicine may be used for other purposes; ask your health care provider or pharmacist if you have questions. COMMON BRAND NAME(S): Adrucil What should I tell my care team before I take this medication? They need to know if you have any of these conditions: blood disorders dihydropyrimidine dehydrogenase (DPD) deficiency infection (especially a virus infection such as chickenpox, cold sores, or herpes) kidney disease liver disease malnourished, poor nutrition recent or ongoing radiation therapy an unusual or allergic reaction to fluorouracil, other chemotherapy, other medicines, foods, dyes, or preservatives pregnant or trying to get pregnant breast-feeding How should I use this medication? This drug is given as an infusion or injection into a vein. It is administered in a hospital or clinic by a specially trained health care professional. Talk to your pediatrician regarding the use of this medicine in children. Special care may be needed. Overdosage: If you think you have taken too much of this medicine contact a poison control center or emergency room at once. NOTE: This medicine is only for you. Do not share this medicine with others. What if I miss a dose? It is important not to miss your dose. Call your doctor or health care professional if you are unable to keep an appointment. What may interact with this medication? Do not take this medicine with any of the following medications: live virus vaccines This medicine may also interact with the following medications: medicines that treat or prevent blood clots like warfarin, enoxaparin, and dalteparin This list may not describe all possible  interactions. Give your health care provider a list of all the medicines, herbs, non-prescription drugs, or dietary supplements you use. Also tell them if you smoke, drink alcohol, or use illegal drugs. Some items may interact with your medicine. What should I watch for while using this medication? Visit your doctor for checks on your progress. This drug may make you feel generally unwell. This is not uncommon, as chemotherapy can affect healthy cells as well as cancer cells. Report any side effects. Continue your course of treatment even though you feel ill unless your doctor tells you to stop. In some cases, you may be given additional medicines to help with side effects. Follow all directions for their use. Call your doctor or health care professional for advice if you get a fever, chills or sore throat, or other symptoms of a cold or flu. Do not treat yourself. This drug decreases your body's ability to fight infections. Try to avoid being around people who are sick. This medicine may increase your risk to bruise or bleed. Call your doctor or health care professional if you notice any unusual bleeding. Be careful brushing and flossing your teeth or using a toothpick because you may get an infection or bleed more easily. If you have any dental work done, tell your dentist you are receiving this medicine. Avoid taking products that contain aspirin, acetaminophen, ibuprofen, naproxen, or ketoprofen unless instructed by your doctor. These medicines may hide a fever. Do not become pregnant while taking this medicine. Women should inform their doctor if they wish to become pregnant or think they might be pregnant. There is a potential for serious side effects to an unborn child. Talk to your health care   professional or pharmacist for more information. Do not breast-feed an infant while taking this medicine. Men should inform their doctor if they wish to father a child. This medicine may lower sperm  counts. Do not treat diarrhea with over the counter products. Contact your doctor if you have diarrhea that lasts more than 2 days or if it is severe and watery. This medicine can make you more sensitive to the sun. Keep out of the sun. If you cannot avoid being in the sun, wear protective clothing and use sunscreen. Do not use sun lamps or tanning beds/booths. What side effects may I notice from receiving this medication? Side effects that you should report to your doctor or health care professional as soon as possible: allergic reactions like skin rash, itching or hives, swelling of the face, lips, or tongue low blood counts - this medicine may decrease the number of white blood cells, red blood cells and platelets. You may be at increased risk for infections and bleeding. signs of infection - fever or chills, cough, sore throat, pain or difficulty passing urine signs of decreased platelets or bleeding - bruising, pinpoint red spots on the skin, black, tarry stools, blood in the urine signs of decreased red blood cells - unusually weak or tired, fainting spells, lightheadedness breathing problems changes in vision chest pain mouth sores nausea and vomiting pain, swelling, redness at site where injected pain, tingling, numbness in the hands or feet redness, swelling, or sores on hands or feet stomach pain unusual bleeding Side effects that usually do not require medical attention (report to your doctor or health care professional if they continue or are bothersome): changes in finger or toe nails diarrhea dry or itchy skin hair loss headache loss of appetite sensitivity of eyes to the light stomach upset unusually teary eyes This list may not describe all possible side effects. Call your doctor for medical advice about side effects. You may report side effects to FDA at 1-800-FDA-1088. Where should I keep my medication? This drug is given in a hospital or clinic and will not be  stored at home. NOTE: This sheet is a summary. It may not cover all possible information. If you have questions about this medicine, talk to your doctor, pharmacist, or health care provider.  2023 [employer] Standard (2021-01-24 00:00:00) Carboplatin injection What is this medication? CARBOPLATIN (KAR boe pla tin) is a chemotherapy drug. It targets fast dividing cells, like cancer cells, and causes these cells to die. This medicine is used to treat ovarian cancer and many other cancers. This medicine may be used for other purposes; ask your health care provider or pharmacist if you have questions. COMMON BRAND NAME(S): Paraplatin What should I tell my care team before I take this medication? They need to know if you have any of these conditions: blood disorders hearing problems kidney disease recent or ongoing radiation therapy an unusual or allergic reaction to carboplatin, cisplatin, other chemotherapy, other medicines, foods, dyes, or preservatives pregnant or trying to get pregnant breast-feeding How should I use this medication? This drug is usually given as an infusion into a vein. It is administered in a hospital or clinic by a specially trained health care professional. Talk to your pediatrician regarding the use of this medicine in children. Special care may be needed. Overdosage: If you think you have taken too much of this medicine contact a poison control center or emergency room at once. NOTE: This medicine is only for you. Do not share this medicine  with others. What if I miss a dose? It is important not to miss a dose. Call your doctor or health care professional if you are unable to keep an appointment. What may interact with this medication? medicines for seizures medicines to increase blood counts like filgrastim, pegfilgrastim, sargramostim some antibiotics like amikacin, gentamicin, neomycin, streptomycin, tobramycin vaccines Talk to your doctor or health care  professional before taking any of these medicines: acetaminophen aspirin ibuprofen ketoprofen naproxen This list may not describe all possible interactions. Give your health care provider a list of all the medicines, herbs, non-prescription drugs, or dietary supplements you use. Also tell them if you smoke, drink alcohol, or use illegal drugs. Some items may interact with your medicine. What should I watch for while using this medication? Your condition will be monitored carefully while you are receiving this medicine. You will need important blood work done while you are taking this medicine. This drug may make you feel generally unwell. This is not uncommon, as chemotherapy can affect healthy cells as well as cancer cells. Report any side effects. Continue your course of treatment even though you feel ill unless your doctor tells you to stop. In some cases, you may be given additional medicines to help with side effects. Follow all directions for their use. Call your doctor or health care professional for advice if you get a fever, chills or sore throat, or other symptoms of a cold or flu. Do not treat yourself. This drug decreases your body's ability to fight infections. Try to avoid being around people who are sick. This medicine may increase your risk to bruise or bleed. Call your doctor or health care professional if you notice any unusual bleeding. Be careful brushing and flossing your teeth or using a toothpick because you may get an infection or bleed more easily. If you have any dental work done, tell your dentist you are receiving this medicine. Avoid taking products that contain aspirin, acetaminophen, ibuprofen, naproxen, or ketoprofen unless instructed by your doctor. These medicines may hide a fever. Do not become pregnant while taking this medicine. Women should inform their doctor if they wish to become pregnant or think they might be pregnant. There is a potential for serious side  effects to an unborn child. Talk to your health care professional or pharmacist for more information. Do not breast-feed an infant while taking this medicine. What side effects may I notice from receiving this medication? Side effects that you should report to your doctor or health care professional as soon as possible: allergic reactions like skin rash, itching or hives, swelling of the face, lips, or tongue signs of infection - fever or chills, cough, sore throat, pain or difficulty passing urine signs of decreased platelets or bleeding - bruising, pinpoint red spots on the skin, black, tarry stools, nosebleeds signs of decreased red blood cells - unusually weak or tired, fainting spells, lightheadedness breathing problems changes in hearing changes in vision chest pain high blood pressure low blood counts - This drug may decrease the number of white blood cells, red blood cells and platelets. You may be at increased risk for infections and bleeding. nausea and vomiting pain, swelling, redness or irritation at the injection site pain, tingling, numbness in the hands or feet problems with balance, talking, walking trouble passing urine or change in the amount of urine Side effects that usually do not require medical attention (report to your doctor or health care professional if they continue or are bothersome): hair loss  loss of appetite metallic taste in the mouth or changes in taste This list may not describe all possible side effects. Call your doctor for medical advice about side effects. You may report side effects to FDA at 1-800-FDA-1088. Where should I keep my medication? This drug is given in a hospital or clinic and will not be stored at home. NOTE: This sheet is a summary. It may not cover all possible information. If you have questions about this medicine, talk to your doctor, pharmacist, or health care provider.  2023 [employer] Standard (2007-08-03  00:00:00) Pembrolizumab injection What is this medication? PEMBROLIZUMAB (pem broe liz ue mab) is a monoclonal antibody. It is used to treat certain types of cancer. This medicine may be used for other purposes; ask your health care provider or pharmacist if you have questions. COMMON BRAND NAME(S): Keytruda What should I tell my care team before I take this medication? They need to know if you have any of these conditions: autoimmune diseases like Crohn's disease, ulcerative colitis, or lupus have had or planning to have an allogeneic stem cell transplant (uses someone else's stem cells) history of organ transplant history of chest radiation nervous system problems like myasthenia gravis or Guillain-Barre syndrome an unusual or allergic reaction to pembrolizumab, other medicines, foods, dyes, or preservatives pregnant or trying to get pregnant breast-feeding How should I use this medication? This medicine is for infusion into a vein. It is given by a health care professional in a hospital or clinic setting. A special MedGuide will be given to you before each treatment. Be sure to read this information carefully each time. Talk to your pediatrician regarding the use of this medicine in children. While this drug may be prescribed for children as young as 6 months for selected conditions, precautions do apply. Overdosage: If you think you have taken too much of this medicine contact a poison control center or emergency room at once. NOTE: This medicine is only for you. Do not share this medicine with others. What if I miss a dose? It is important not to miss your dose. Call your doctor or health care professional if you are unable to keep an appointment. What may interact with this medication? Interactions have not been studied. This list may not describe all possible interactions. Give your health care provider a list of all the medicines, herbs, non-prescription drugs, or dietary  supplements you use. Also tell them if you smoke, drink alcohol, or use illegal drugs. Some items may interact with your medicine. What should I watch for while using this medication? Your condition will be monitored carefully while you are receiving this medicine. You may need blood work done while you are taking this medicine. Do not become pregnant while taking this medicine or for 4 months after stopping it. Women should inform their doctor if they wish to become pregnant or think they might be pregnant. There is a potential for serious side effects to an unborn child. Talk to your health care professional or pharmacist for more information. Do not breast-feed an infant while taking this medicine or for 4 months after the last dose. What side effects may I notice from receiving this medication? Side effects that you should report to your doctor or health care professional as soon as possible: allergic reactions like skin rash, itching or hives, swelling of the face, lips, or tongue bloody or black, tarry breathing problems changes in vision chest pain chills confusion constipation cough diarrhea dizziness or feeling faint or  lightheaded fast or irregular heartbeat fever flushing joint pain low blood counts - this medicine may decrease the number of white blood cells, red blood cells and platelets. You may be at increased risk for infections and bleeding. muscle pain muscle weakness pain, tingling, numbness in the hands or feet persistent headache redness, blistering, peeling or loosening of the skin, including inside the mouth signs and symptoms of high blood sugar such as dizziness; dry mouth; dry skin; fruity breath; nausea; stomach pain; increased hunger or thirst; increased urination signs and symptoms of kidney injury like trouble passing urine or change in the amount of urine signs and symptoms of liver injury like dark urine, light-colored stools, loss of appetite, nausea,  right upper belly pain, yellowing of the eyes or skin sweating swollen lymph nodes weight loss Side effects that usually do not require medical attention (report to your doctor or health care professional if they continue or are bothersome): decreased appetite hair loss tiredness This list may not describe all possible side effects. Call your doctor for medical advice about side effects. You may report side effects to FDA at 1-800-FDA-1088. Where should I keep my medication? This drug is given in a hospital or clinic and will not be stored at home. NOTE: This sheet is a summary. It may not cover all possible information. If you have questions about this medicine, talk to your doctor, pharmacist, or health care provider.  2023 [employer] Standard (2021-01-24 00:00:00)

## 2021-10-07 ENCOUNTER — Other Ambulatory Visit: Payer: Self-pay | Admitting: Oncology

## 2021-10-07 DIAGNOSIS — C090 Malignant neoplasm of tonsillar fossa: Secondary | ICD-10-CM | POA: Diagnosis not present

## 2021-10-07 DIAGNOSIS — I890 Lymphedema, not elsewhere classified: Secondary | ICD-10-CM | POA: Diagnosis not present

## 2021-10-07 DIAGNOSIS — Z510 Encounter for antineoplastic radiation therapy: Secondary | ICD-10-CM | POA: Diagnosis not present

## 2021-10-07 DIAGNOSIS — C7951 Secondary malignant neoplasm of bone: Secondary | ICD-10-CM | POA: Diagnosis not present

## 2021-10-08 ENCOUNTER — Telehealth: Payer: Self-pay

## 2021-10-08 DIAGNOSIS — C090 Malignant neoplasm of tonsillar fossa: Secondary | ICD-10-CM | POA: Diagnosis not present

## 2021-10-08 DIAGNOSIS — Z510 Encounter for antineoplastic radiation therapy: Secondary | ICD-10-CM | POA: Diagnosis not present

## 2021-10-08 DIAGNOSIS — C7951 Secondary malignant neoplasm of bone: Secondary | ICD-10-CM | POA: Diagnosis not present

## 2021-10-08 NOTE — Telephone Encounter (Signed)
I spoke with pt to see how he was doing. Pt reports intermittent nausea, relieved w/ anti-emetic. Pt denies fever, emesis, diarrhea, mouth sores, chest pain, SOB, and skin rash/itching. I reminded him to call us if he develops temp of 100.4 or higher, day or night. He verbalized understanding.

## 2021-10-10 ENCOUNTER — Inpatient Hospital Stay: Payer: PPO | Attending: Oncology

## 2021-10-10 VITALS — BP 126/57 | HR 108 | Temp 99.8°F | Resp 18 | Wt 162.0 lb

## 2021-10-10 DIAGNOSIS — C099 Malignant neoplasm of tonsil, unspecified: Secondary | ICD-10-CM | POA: Insufficient documentation

## 2021-10-10 DIAGNOSIS — C7801 Secondary malignant neoplasm of right lung: Secondary | ICD-10-CM | POA: Insufficient documentation

## 2021-10-10 DIAGNOSIS — C7802 Secondary malignant neoplasm of left lung: Secondary | ICD-10-CM | POA: Diagnosis not present

## 2021-10-10 DIAGNOSIS — C7800 Secondary malignant neoplasm of unspecified lung: Secondary | ICD-10-CM

## 2021-10-10 DIAGNOSIS — Z79899 Other long term (current) drug therapy: Secondary | ICD-10-CM | POA: Diagnosis not present

## 2021-10-10 DIAGNOSIS — C7951 Secondary malignant neoplasm of bone: Secondary | ICD-10-CM | POA: Insufficient documentation

## 2021-10-10 MED ORDER — SODIUM CHLORIDE 0.9% FLUSH
10.0000 mL | INTRAVENOUS | Status: DC | PRN
  Administered 2021-10-10: 10 mL

## 2021-10-10 MED ORDER — HEPARIN SOD (PORK) LOCK FLUSH 100 UNIT/ML IV SOLN
500.0000 [IU] | Freq: Once | INTRAVENOUS | Status: AC | PRN
  Administered 2021-10-10: 500 [IU]

## 2021-10-10 NOTE — Patient Instructions (Signed)
Cody Benjamin  Discharge Instructions: Thank you for choosing [facility] to provide your oncology and hematology care.  If you have a lab appointment with the [facility], please go directly to the [facility] and check in at the registration area.   Wear comfortable clothing and clothing appropriate for easy access to any Portacath or PICC line.   We strive to give you quality time with your provider. You may need to reschedule your appointment if you arrive late (15 or more minutes).  Arriving late affects you and other patients whose appointments are after yours.  Also, if you miss three or more appointments without notifying the office, you may be dismissed from the clinic at the provider's discretion.      For prescription refill requests, have your pharmacy contact our office and allow 72 hours for refills to be completed.    Today you received the following chemotherapy and/or immunotherapy agents 63fouruoracilFluorouracil Injection What is this medication? FLUOROURACIL (flure oh YOOR a sil) treats some types of cancer. It works by slowing down the growth of cancer cells. This medicine may be used for other purposes; ask your health care provider or pharmacist if you have questions. COMMON BRAND NAME(S): Adrucil What should I tell my care team before I take this medication? They need to know if you have any of these conditions: Blood disorders Dihydropyrimidine dehydrogenase (DPD) deficiency Infection, such as chickenpox, cold sores, herpes Kidney disease Liver disease Poor nutrition Recent or ongoing radiation therapy An unusual or allergic reaction to fluorouracil, other medications, foods, dyes, or preservatives If you or your partner are pregnant or trying to get pregnant Breast-feeding How should I use this medication? This medication is injected into a vein. It is administered by your care team in a hospital or clinic setting. Talk  to your care team about the use of this medication in children. Special care may be needed. Overdosage: If you think you have taken too much of this medicine contact a poison control center or emergency room at once. NOTE: This medicine is only for you. Do not share this medicine with others. What if I miss a dose? Keep appointments for follow-up doses. It is important not to miss your dose. Call your care team if you are unable to keep an appointment. What may interact with this medication? Do not take this medication with any of the following: Live virus vaccines This medication may also interact with the following: Medications that treat or prevent blood clots, such as warfarin, enoxaparin, dalteparin This list may not describe all possible interactions. Give your health care provider a list of all the medicines, herbs, non-prescription drugs, or dietary supplements you use. Also tell them if you smoke, drink alcohol, or use illegal drugs. Some items may interact with your medicine. What should I watch for while using this medication? Your condition will be monitored carefully while you are receiving this medication. This medication may make you feel generally unwell. This is not uncommon as chemotherapy can affect healthy cells as well as cancer cells. Report any side effects. Continue your course of treatment even though you feel ill unless your care team tells you to stop. In some cases, you may be given additional medications to help with side effects. Follow all directions for their use. This medication may increase your risk of getting an infection. Call your care team for advice if you get a fever, chills, sore throat, or other symptoms of  a cold or flu. Do not treat yourself. Try to avoid being around people who are sick. This medication may increase your risk to bruise or bleed. Call your care team if you notice any unusual bleeding. Be careful brushing or flossing your teeth or using a  toothpick because you may get an infection or bleed more easily. If you have any dental work done, tell your dentist you are receiving this medication. Avoid taking medications that contain aspirin, acetaminophen, ibuprofen, naproxen, or ketoprofen unless instructed by your care team. These medications may hide a fever. Do not treat diarrhea with over the counter products. Contact your care team if you have diarrhea that lasts more than 2 days or if it is severe and watery. This medication can make you more sensitive to the sun. Keep out of the sun. If you cannot avoid being in the sun, wear protective clothing and sunscreen. Do not use sun lamps, tanning beds, or tanning booths. Talk to your care team if you or your partner wish to become pregnant or think you might be pregnant. This medication can cause serious birth defects if taken during pregnancy and for 3 months after the last dose. A reliable form of contraception is recommended while taking this medication and for 3 months after the last dose. Talk to your care team about effective forms of contraception. Do not father a child while taking this medication and for 3 months after the last dose. Use a condom while having sex during this time period. Do not breastfeed while taking this medication. This medication may cause infertility. Talk to your care team if you are concerned about your fertility. What side effects may I notice from receiving this medication? Side effects that you should report to your care team as soon as possible: Allergic reactions--skin rash, itching, hives, swelling of the face, lips, tongue, or throat Heart attack--pain or tightness in the chest, shoulders, arms, or jaw, nausea, shortness of breath, cold or clammy skin, feeling faint or lightheaded Heart failure--shortness of breath, swelling of the ankles, feet, or hands, sudden weight gain, unusual weakness or fatigue Heart rhythm changes--fast or irregular heartbeat,  dizziness, feeling faint or lightheaded, chest pain, trouble breathing High ammonia level--unusual weakness or fatigue, confusion, loss of appetite, nausea, vomiting, seizures Infection--fever, chills, cough, sore throat, wounds that don't heal, pain or trouble when passing urine, general feeling of discomfort or being unwell Low red blood cell level--unusual weakness or fatigue, dizziness, headache, trouble breathing Pain, tingling, or numbness in the hands or feet, muscle weakness, change in vision, confusion or trouble speaking, loss of balance or coordination, trouble walking, seizures Redness, swelling, and blistering of the skin over hands and feet Severe or prolonged diarrhea Unusual bruising or bleeding Side effects that usually do not require medical attention (report to your care team if they continue or are bothersome): Dry skin Headache Increased tears Nausea Pain, redness, or swelling with sores inside the mouth or throat Sensitivity to light Vomiting This list may not describe all possible side effects. Call your doctor for medical advice about side effects. You may report side effects to FDA at 1-800-FDA-1088. Where should I keep my medication? This medication is given in a hospital or clinic. It will not be stored at home. NOTE: This sheet is a summary. It may not cover all possible information. If you have questions about this medicine, talk to your doctor, pharmacist, or health care provider.  2023 Elsevier/Gold Standard (2021-07-01 00:00:00)  To help prevent nausea and vomiting after your treatment, we encourage you to take your nausea medication as directed.  BELOW ARE SYMPTOMS THAT SHOULD BE REPORTED IMMEDIATELY: *FEVER GREATER THAN 100.4 F (38 C) OR HIGHER *CHILLS OR SWEATING *NAUSEA AND VOMITING THAT IS NOT CONTROLLED WITH YOUR NAUSEA MEDICATION *UNUSUAL SHORTNESS OF BREATH *UNUSUAL BRUISING OR BLEEDING *URINARY PROBLEMS (pain or burning when urinating, or  frequent urination) *BOWEL PROBLEMS (unusual diarrhea, constipation, pain near the anus) TENDERNESS IN MOUTH AND THROAT WITH OR WITHOUT PRESENCE OF ULCERS (sore throat, sores in mouth, or a toothache) UNUSUAL RASH, SWELLING OR PAIN  UNUSUAL VAGINAL DISCHARGE OR ITCHING   Items with * indicate a potential emergency and should be followed up as soon as possible or go to the Emergency Department if any problems should occur.  Please show the CHEMOTHERAPY ALERT CARD or IMMUNOTHERAPY ALERT CARD at check-in to the Emergency Department and triage nurse.  Should you have questions after your visit or need to cancel or reschedule your appointment, please contact [facility]  Dept: [phone]  and follow the prompts.  Office hours are 8:00 a.m. to 4:30 p.m. Monday - Friday. Please note that voicemails left after 4:00 p.m. may not be returned until the following business day.  We are closed weekends and major holidays. You have access to a nurse at all times for urgent questions. Please call the main number to the clinic Dept: [phone] and follow the prompts.  For any non-urgent questions, you may also contact your provider using MyChart. We now offer e-Visits for anyone 61 and older to request care online for non-urgent symptoms. For details visit [email or web address].   Also download the MyChart app! Go to the app store, search "MyChart", open the app, select [facility], and log in with your MyChart username and password.  Masks are optional in the cancer centers. If you would like for your care team to wear a mask while they are taking care of you, please let them know. You may have one support person who is at least 74 years old accompany you for your appointments.

## 2021-10-10 NOTE — Progress Notes (Signed)
The chemotherapy medication bag should finish at 46 hours, 96 hours, or 7 days. For example, if your pump is scheduled for 46 hours and it was put on at 4:00 p.m., it should finish at 2:00 p.m. the day it is scheduled to come off regardless of your appointment time.     Estimated time to finish at 1408.   If the display on your pump reads "Low Volume" and it is beeping, take the batteries out of the pump and come to the cancer center for it to be taken off.   If the pump alarms go off prior to the pump reading "Low Volume" then call [phone] and someone can assist you.  If the plunger comes out and the chemotherapy medication is leaking out, please use your home chemo spill kit to clean up the spill. Do NOT use paper towels or other household products.  If you have problems or questions regarding your pump, please call either [phone] (24 hours a day) or the cancer center Monday-Friday 8:00 a.m.- 4:30 p.m. at the clinic number and we will assist you. If you are unable to get assistance, then go to the nearest Emergency Department and ask the staff to contact the IV team for assistance.

## 2021-10-13 DIAGNOSIS — C7951 Secondary malignant neoplasm of bone: Secondary | ICD-10-CM | POA: Diagnosis not present

## 2021-10-13 DIAGNOSIS — C099 Malignant neoplasm of tonsil, unspecified: Secondary | ICD-10-CM | POA: Diagnosis not present

## 2021-10-17 ENCOUNTER — Other Ambulatory Visit: Payer: PPO

## 2021-10-17 ENCOUNTER — Ambulatory Visit: Payer: PPO | Admitting: Oncology

## 2021-10-22 DIAGNOSIS — E785 Hyperlipidemia, unspecified: Secondary | ICD-10-CM | POA: Diagnosis not present

## 2021-10-22 DIAGNOSIS — J90 Pleural effusion, not elsewhere classified: Secondary | ICD-10-CM | POA: Diagnosis not present

## 2021-10-22 DIAGNOSIS — C801 Malignant (primary) neoplasm, unspecified: Secondary | ICD-10-CM | POA: Diagnosis not present

## 2021-10-22 DIAGNOSIS — R0602 Shortness of breath: Secondary | ICD-10-CM | POA: Diagnosis not present

## 2021-10-22 DIAGNOSIS — I509 Heart failure, unspecified: Secondary | ICD-10-CM | POA: Diagnosis not present

## 2021-10-22 DIAGNOSIS — B370 Candidal stomatitis: Secondary | ICD-10-CM | POA: Diagnosis not present

## 2021-10-22 DIAGNOSIS — Z881 Allergy status to other antibiotic agents status: Secondary | ICD-10-CM | POA: Diagnosis not present

## 2021-10-22 DIAGNOSIS — R778 Other specified abnormalities of plasma proteins: Secondary | ICD-10-CM | POA: Diagnosis not present

## 2021-10-22 DIAGNOSIS — C7800 Secondary malignant neoplasm of unspecified lung: Secondary | ICD-10-CM | POA: Diagnosis not present

## 2021-10-22 DIAGNOSIS — C773 Secondary and unspecified malignant neoplasm of axilla and upper limb lymph nodes: Secondary | ICD-10-CM | POA: Diagnosis not present

## 2021-10-22 DIAGNOSIS — D649 Anemia, unspecified: Secondary | ICD-10-CM | POA: Diagnosis not present

## 2021-10-22 DIAGNOSIS — Z87891 Personal history of nicotine dependence: Secondary | ICD-10-CM | POA: Diagnosis not present

## 2021-10-22 DIAGNOSIS — E871 Hypo-osmolality and hyponatremia: Secondary | ICD-10-CM | POA: Diagnosis not present

## 2021-10-22 DIAGNOSIS — I2510 Atherosclerotic heart disease of native coronary artery without angina pectoris: Secondary | ICD-10-CM | POA: Diagnosis not present

## 2021-10-22 DIAGNOSIS — I340 Nonrheumatic mitral (valve) insufficiency: Secondary | ICD-10-CM | POA: Diagnosis not present

## 2021-10-22 DIAGNOSIS — Z79899 Other long term (current) drug therapy: Secondary | ICD-10-CM | POA: Diagnosis not present

## 2021-10-22 DIAGNOSIS — I252 Old myocardial infarction: Secondary | ICD-10-CM | POA: Diagnosis not present

## 2021-10-22 DIAGNOSIS — I2699 Other pulmonary embolism without acute cor pulmonale: Secondary | ICD-10-CM | POA: Diagnosis not present

## 2021-10-22 DIAGNOSIS — R000 Tachycardia, unspecified: Secondary | ICD-10-CM | POA: Diagnosis not present

## 2021-10-22 DIAGNOSIS — Z7901 Long term (current) use of anticoagulants: Secondary | ICD-10-CM | POA: Diagnosis not present

## 2021-10-22 DIAGNOSIS — J9601 Acute respiratory failure with hypoxia: Secondary | ICD-10-CM | POA: Diagnosis not present

## 2021-10-22 DIAGNOSIS — I214 Non-ST elevation (NSTEMI) myocardial infarction: Secondary | ICD-10-CM | POA: Diagnosis not present

## 2021-10-22 DIAGNOSIS — Z9981 Dependence on supplemental oxygen: Secondary | ICD-10-CM | POA: Diagnosis not present

## 2021-10-22 DIAGNOSIS — I110 Hypertensive heart disease with heart failure: Secondary | ICD-10-CM | POA: Diagnosis not present

## 2021-10-22 DIAGNOSIS — I5023 Acute on chronic systolic (congestive) heart failure: Secondary | ICD-10-CM | POA: Diagnosis not present

## 2021-10-22 DIAGNOSIS — I249 Acute ischemic heart disease, unspecified: Secondary | ICD-10-CM | POA: Diagnosis not present

## 2021-10-22 DIAGNOSIS — Z8589 Personal history of malignant neoplasm of other organs and systems: Secondary | ICD-10-CM | POA: Diagnosis not present

## 2021-10-22 DIAGNOSIS — I361 Nonrheumatic tricuspid (valve) insufficiency: Secondary | ICD-10-CM | POA: Diagnosis not present

## 2021-10-22 DIAGNOSIS — E876 Hypokalemia: Secondary | ICD-10-CM | POA: Diagnosis not present

## 2021-10-22 DIAGNOSIS — Z7982 Long term (current) use of aspirin: Secondary | ICD-10-CM | POA: Diagnosis not present

## 2021-10-22 DIAGNOSIS — I10 Essential (primary) hypertension: Secondary | ICD-10-CM | POA: Diagnosis not present

## 2021-10-23 ENCOUNTER — Encounter: Payer: Self-pay | Admitting: Oncology

## 2021-10-23 DIAGNOSIS — J90 Pleural effusion, not elsewhere classified: Secondary | ICD-10-CM

## 2021-10-23 DIAGNOSIS — I10 Essential (primary) hypertension: Secondary | ICD-10-CM

## 2021-10-23 DIAGNOSIS — R778 Other specified abnormalities of plasma proteins: Secondary | ICD-10-CM

## 2021-10-23 DIAGNOSIS — I249 Acute ischemic heart disease, unspecified: Secondary | ICD-10-CM

## 2021-10-23 DIAGNOSIS — J9601 Acute respiratory failure with hypoxia: Secondary | ICD-10-CM

## 2021-10-23 DIAGNOSIS — I509 Heart failure, unspecified: Secondary | ICD-10-CM

## 2021-10-23 DIAGNOSIS — E785 Hyperlipidemia, unspecified: Secondary | ICD-10-CM

## 2021-10-24 ENCOUNTER — Inpatient Hospital Stay: Payer: PPO

## 2021-10-24 ENCOUNTER — Ambulatory Visit: Payer: PPO | Admitting: Oncology

## 2021-10-24 DIAGNOSIS — R778 Other specified abnormalities of plasma proteins: Secondary | ICD-10-CM | POA: Diagnosis not present

## 2021-10-24 DIAGNOSIS — E785 Hyperlipidemia, unspecified: Secondary | ICD-10-CM | POA: Diagnosis not present

## 2021-10-24 DIAGNOSIS — I249 Acute ischemic heart disease, unspecified: Secondary | ICD-10-CM | POA: Diagnosis not present

## 2021-10-24 DIAGNOSIS — R000 Tachycardia, unspecified: Secondary | ICD-10-CM

## 2021-10-27 ENCOUNTER — Telehealth: Payer: Self-pay | Admitting: Oncology

## 2021-10-27 ENCOUNTER — Ambulatory Visit: Payer: PPO

## 2021-10-27 NOTE — Telephone Encounter (Signed)
Contacted pt to discuss new appts. Unable to reach via phone, voicemail was left requesting he call the office to discuss.  Scheduling Message Entered by Juanetta Beets on 10/23/2021 at  5:34 PM Priority: Routine <No visit type provided>  Department: [facility]  Provider:   Appointment Notes:  Pt is currently in the hospital but [facility] asked me to add appts for 8/25 and 8/28 but pt doesn't know.  Please contact patient early next week to let him know about appts.  Scheduling Notes:

## 2021-10-28 DIAGNOSIS — J9601 Acute respiratory failure with hypoxia: Secondary | ICD-10-CM | POA: Diagnosis not present

## 2021-10-28 DIAGNOSIS — I2699 Other pulmonary embolism without acute cor pulmonale: Secondary | ICD-10-CM | POA: Diagnosis not present

## 2021-10-28 DIAGNOSIS — I509 Heart failure, unspecified: Secondary | ICD-10-CM | POA: Diagnosis not present

## 2021-10-29 DIAGNOSIS — I5021 Acute systolic (congestive) heart failure: Secondary | ICD-10-CM | POA: Diagnosis not present

## 2021-10-29 DIAGNOSIS — Z87891 Personal history of nicotine dependence: Secondary | ICD-10-CM | POA: Diagnosis not present

## 2021-10-29 DIAGNOSIS — J439 Emphysema, unspecified: Secondary | ICD-10-CM | POA: Diagnosis not present

## 2021-10-29 DIAGNOSIS — D630 Anemia in neoplastic disease: Secondary | ICD-10-CM | POA: Diagnosis not present

## 2021-10-29 DIAGNOSIS — E871 Hypo-osmolality and hyponatremia: Secondary | ICD-10-CM | POA: Diagnosis not present

## 2021-10-29 DIAGNOSIS — B370 Candidal stomatitis: Secondary | ICD-10-CM | POA: Diagnosis not present

## 2021-10-29 DIAGNOSIS — C801 Malignant (primary) neoplasm, unspecified: Secondary | ICD-10-CM | POA: Diagnosis not present

## 2021-10-29 DIAGNOSIS — I081 Rheumatic disorders of both mitral and tricuspid valves: Secondary | ICD-10-CM | POA: Diagnosis not present

## 2021-10-29 DIAGNOSIS — J90 Pleural effusion, not elsewhere classified: Secondary | ICD-10-CM | POA: Diagnosis not present

## 2021-10-29 DIAGNOSIS — R918 Other nonspecific abnormal finding of lung field: Secondary | ICD-10-CM | POA: Diagnosis not present

## 2021-10-29 DIAGNOSIS — J9601 Acute respiratory failure with hypoxia: Secondary | ICD-10-CM | POA: Diagnosis not present

## 2021-10-29 DIAGNOSIS — I2510 Atherosclerotic heart disease of native coronary artery without angina pectoris: Secondary | ICD-10-CM | POA: Diagnosis not present

## 2021-10-29 DIAGNOSIS — C773 Secondary and unspecified malignant neoplasm of axilla and upper limb lymph nodes: Secondary | ICD-10-CM | POA: Diagnosis not present

## 2021-10-29 DIAGNOSIS — M19019 Primary osteoarthritis, unspecified shoulder: Secondary | ICD-10-CM | POA: Diagnosis not present

## 2021-10-29 DIAGNOSIS — I509 Heart failure, unspecified: Secondary | ICD-10-CM | POA: Diagnosis not present

## 2021-10-29 DIAGNOSIS — I700 Atherosclerosis of aorta: Secondary | ICD-10-CM | POA: Diagnosis not present

## 2021-10-29 DIAGNOSIS — Z9981 Dependence on supplemental oxygen: Secondary | ICD-10-CM | POA: Diagnosis not present

## 2021-10-29 DIAGNOSIS — Z7901 Long term (current) use of anticoagulants: Secondary | ICD-10-CM | POA: Diagnosis not present

## 2021-10-29 DIAGNOSIS — Z79891 Long term (current) use of opiate analgesic: Secondary | ICD-10-CM | POA: Diagnosis not present

## 2021-10-29 DIAGNOSIS — I214 Non-ST elevation (NSTEMI) myocardial infarction: Secondary | ICD-10-CM | POA: Diagnosis not present

## 2021-10-29 DIAGNOSIS — Z7982 Long term (current) use of aspirin: Secondary | ICD-10-CM | POA: Diagnosis not present

## 2021-10-29 DIAGNOSIS — I110 Hypertensive heart disease with heart failure: Secondary | ICD-10-CM | POA: Diagnosis not present

## 2021-10-29 DIAGNOSIS — E876 Hypokalemia: Secondary | ICD-10-CM | POA: Diagnosis not present

## 2021-10-29 DIAGNOSIS — Z85818 Personal history of malignant neoplasm of other sites of lip, oral cavity, and pharynx: Secondary | ICD-10-CM | POA: Diagnosis not present

## 2021-10-29 DIAGNOSIS — I2699 Other pulmonary embolism without acute cor pulmonale: Secondary | ICD-10-CM | POA: Diagnosis not present

## 2021-10-31 ENCOUNTER — Inpatient Hospital Stay (INDEPENDENT_AMBULATORY_CARE_PROVIDER_SITE_OTHER): Payer: PPO | Admitting: Oncology

## 2021-10-31 ENCOUNTER — Encounter: Payer: Self-pay | Admitting: Cardiology

## 2021-10-31 ENCOUNTER — Other Ambulatory Visit: Payer: Self-pay | Admitting: Oncology

## 2021-10-31 ENCOUNTER — Telehealth: Payer: Self-pay

## 2021-10-31 ENCOUNTER — Inpatient Hospital Stay: Payer: PPO

## 2021-10-31 ENCOUNTER — Ambulatory Visit (INDEPENDENT_AMBULATORY_CARE_PROVIDER_SITE_OTHER): Payer: PPO | Admitting: Cardiology

## 2021-10-31 VITALS — BP 112/58 | HR 104 | Temp 97.6°F | Resp 16 | Ht 69.0 in | Wt 133.1 lb

## 2021-10-31 VITALS — BP 105/66 | HR 100 | Ht 68.0 in | Wt 133.0 lb

## 2021-10-31 DIAGNOSIS — I7090 Unspecified atherosclerosis: Secondary | ICD-10-CM

## 2021-10-31 DIAGNOSIS — C099 Malignant neoplasm of tonsil, unspecified: Secondary | ICD-10-CM

## 2021-10-31 DIAGNOSIS — Z95828 Presence of other vascular implants and grafts: Secondary | ICD-10-CM | POA: Diagnosis not present

## 2021-10-31 DIAGNOSIS — C7800 Secondary malignant neoplasm of unspecified lung: Secondary | ICD-10-CM

## 2021-10-31 DIAGNOSIS — C801 Malignant (primary) neoplasm, unspecified: Secondary | ICD-10-CM

## 2021-10-31 DIAGNOSIS — D649 Anemia, unspecified: Secondary | ICD-10-CM | POA: Diagnosis not present

## 2021-10-31 LAB — CBC AND DIFFERENTIAL
HCT: 30 — AB (ref 41–53)
Hemoglobin: 9.8 — AB (ref 13.5–17.5)
Neutrophils Absolute: 4.73
Platelets: 584 10*3/uL — AB (ref 150–400)
WBC: 6.3

## 2021-10-31 LAB — BASIC METABOLIC PANEL
BUN: 21 (ref 4–21)
CO2: 39 — AB (ref 13–22)
Chloride: 86 — AB (ref 99–108)
Creatinine: 0.8 (ref 0.6–1.3)
Glucose: 131
Potassium: 2.9 mEq/L — AB (ref 3.5–5.1)
Sodium: 133 — AB (ref 137–147)

## 2021-10-31 LAB — HEPATIC FUNCTION PANEL
ALT: 25 U/L (ref 10–40)
AST: 44 — AB (ref 14–40)
Alkaline Phosphatase: 106 (ref 25–125)
Bilirubin, Total: 0.9

## 2021-10-31 LAB — CBC: RBC: 3.46 — AB (ref 3.87–5.11)

## 2021-10-31 LAB — COMPREHENSIVE METABOLIC PANEL
Albumin: 3.4 — AB (ref 3.5–5.0)
Calcium: 9.4 (ref 8.7–10.7)

## 2021-10-31 LAB — TSH: TSH: 6.897 u[IU]/mL — ABNORMAL HIGH (ref 0.350–4.500)

## 2021-10-31 MED ORDER — MEGESTROL ACETATE 400 MG/10ML PO SUSP: 800.0000 mg | Freq: Every day | ORAL | 1 refills | Status: AC

## 2021-10-31 MED ORDER — FLUCONAZOLE 100 MG PO TABS: 100.0000 mg | ORAL_TABLET | Freq: Every day | ORAL | 0 refills | Status: AC

## 2021-10-31 NOTE — Progress Notes (Signed)
Cardiology Office Note:    Date:  10/31/2021   ID:  Cody Benjamin, DOB [birth year], MRN [id number]  PCP:  Darrol Jump, PA-C  Cardiologist:  Jenean Lindau, MD   Referring MD: Darrol Jump, PA-C    ASSESSMENT:    1. Metastatic squamous cell carcinoma involving lung with unknown primary site, unspecified laterality ([facility])   2. Atherosclerotic vascular disease   3. H/O endovascular stent graft for abdominal aortic aneurysm    PLAN:    In order of problems listed above:  Atherosclerotic vascular disease: Secondary prevention stressed.  I am not sure there is any value for keeping him on statin therapy.  He has advanced metastatic cancer and is very frail.  Family is considering hospice and patient is thinking about DNR status.  Also LFTs are elevated so we will discontinue statins at this time. History of pulmonary embolism on anticoagulation: This is managed by primary care. Congestive heart failure: Patient has received diuresis.  He is doing well from this perspective.  Ejection fraction was preserved. Hypokalemia from lab work done today.  We will contact cancer center and talk to the office which did the blood work for follow-up. He will be seen in follow-up appointment on a as needed basis only.  Son and daughter had multiple questions which were answered to their satisfaction.   Medication Adjustments/Labs and Tests Ordered: Current medicines are reviewed at length with the patient today.  Concerns regarding medicines are outlined above.  No orders of the defined types were placed in this encounter.  No orders of the defined types were placed in this encounter.    No chief complaint on file.    History of Present Illness:    Cody Benjamin is a 76 y.o. male.  Patient has past medical history of abdominal aortic aneurysm, atherosclerotic vascular disease dyslipidemia.  He has metastatic cancer and was admitted to the hospital with pulmonary embolism.  He denies any  problems at this time except for feeling extremely weak.  No chest pain orthopnea or PND.  He is brought in in a wheelchair by his son and daughter.  They are contemplating about getting hospice involved as soon as possible.  Patient appears very frail.  Past Medical History:  Diagnosis Date   AAA (abdominal aortic aneurysm) ([place]) 12/27/2017   Abdominal aortic aneurysm (AAA) without rupture ([place]) 12/15/2017   Admission for fitting and adjustment of vascular catheter 12/15/2017   Arthritis    right shoulder   Atherosclerotic vascular disease 07/26/2020   BPH (benign prostatic hyperplasia)    Chronic kidney disease    history of kidney stones   Drug therapy    Elevated blood sugar    Ex-cigarette smoker 12/15/2017   Fever    Gastrostomy in place [facility]) 10/31/2018   H/O endovascular stent graft for abdominal aortic aneurysm 07/26/2020   History of kidney stones    Lipids abnormal    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   atorvastatin (LIPITOR) 40 MG tablet Take 40 mg by mouth at bedtime.   cetirizine (ZYRTEC) 10 MG tablet Take 10 mg by mouth daily.   Cholecalciferol (VITAMIN D3 PO) Take 125 mcg by mouth daily at 12 noon.   digoxin (LANOXIN) 0.125 MG tablet Take 125 mcg by mouth daily.   docusate sodium (COLACE) 100 MG capsule Take 100 mg by mouth 2 (two) times daily.   ELIQUIS 5 MG TABS tablet Take 5 mg by mouth 2 (two) times daily.   fluconazole  (DIFLUCAN) 100 MG tablet Take 1 tablet (100 mg total) by mouth daily. 2 pills on day 1; 1 pill on days 2-5   furosemide (LASIX) 40 MG tablet Take 40 mg by mouth daily.   megestrol (MEGACE) 400 MG/10ML suspension Take 20 mLs (800 mg total) by mouth daily.   metoprolol tartrate (LOPRESSOR) 25 MG tablet Take 25 mg by mouth 2 (two) times daily.   morphine (MS CONTIN) 15 MG 12 hr tablet Take by mouth.   ondansetron (ZOFRAN) 4 MG tablet Take 1 tablet (4 mg total) by mouth every 4 (four) hours as needed for nausea or vomiting.   Oxycodone HCl 10 MG TABS Take 1 tablet (10 mg total) by mouth every 4 (four) hours as needed.   pantoprazole (PROTONIX) 40 MG tablet Take 1 tablet by mouth daily.   potassium chloride (MICRO-K) 10 MEQ CR capsule Take 10 mEq by mouth 2 (two) times daily.   prochlorperazine (COMPAZINE) 10 MG tablet Take 1 tablet (10 mg total) by mouth every 6 (six) hours as needed for nausea or vomiting.     Allergies:   Levofloxacin   Social History   Socioeconomic History   Marital status: Married    Spouse name: Not on file   Number of children: Not on file   Years of education: Not on file   Highest education level: Not on file  Occupational History   Not on file  Tobacco Use   Smoking status: Former    Types: Cigarettes    Quit date: 03/09/2013    Years since quitting: 8.6   Smokeless tobacco: Never  Vaping Use   Vaping Use: Every day  Substance and Sexual Activity   Alcohol use: Never   Drug use: Never   Sexual activity: Not on file  Other Topics Concern   Not on file  Social History Narrative   Not on file   Social Determinants of Health   Financial Resource Strain: Not on file  Food Insecurity: Not on file  Transportation Needs: Not on file  Physical Activity: Not on file  Stress: Not on file  Social Connections: Not on file     Family History: The patient's family history includes [place] cancer in his mother; Seizures in his brother.  ROS:   Please see the  history of present illness.    All other systems reviewed and are negative.  EKGs/Labs/Other Studies Reviewed:    The following studies were reviewed today: I discussed my findings with the patient at length.  All hospital records reviewed and cardiology consultation reviewed.   Recent Labs: 10/31/2021: ALT 25; BUN 21; Creatinine 0.8; Hemoglobin 9.8; Platelets 584; Potassium 2.9; Sodium 133; TSH 6.897  Recent Lipid Panel    Component Value Date/Time   CHOL 140 03/17/2021 1120   TRIG 90 03/17/2021 1120   HDL 42 03/17/2021 1120   CHOLHDL 3.3 03/17/2021 1120  [place] 81 03/17/2021 1120    Physical Exam:    VS:  Ht '5\' 8"'$  (1.727 m)   Wt 133 lb (60.3 kg)   BMI 20.22 kg/m     Wt Readings from Last 3 Encounters:  10/31/21 133 lb (60.3 kg)  10/31/21 133 lb 1.6 oz (60.4 kg)  10/10/21 162 lb (73.5 kg)     GEN: Patient is in no acute distress HEENT: Normal NECK: No JVD; No carotid bruits LYMPHATICS: No lymphadenopathy CARDIAC: Hear sounds regular, 2/6 systolic murmur at the apex. RESPIRATORY:  Clear to auscultation without rales, wheezing or rhonchi  ABDOMEN: Soft, non-tender, non-distended MUSCULOSKELETAL:  No edema; No deformity  SKIN: Warm and dry NEUROLOGIC:  Alert and oriented x 3 PSYCHIATRIC:  Normal affect   Signed, Jenean Lindau, MD  10/31/2021 2:25 PM    [facility]

## 2021-10-31 NOTE — Patient Instructions (Addendum)
Medication Instructions:  Your physician has recommended you make the following change in your medication:   Stop Atorvastatin  *If you need a refill on your cardiac medications before your next appointment, please call your pharmacy*   Lab Work: None ordered If you have labs (blood work) drawn today and your tests are completely normal, you will receive your results only by: [facility] (if you have MyChart) OR A paper copy in the mail If you have any lab test that is abnormal or we need to change your treatment, we will call you to review the results.   Testing/Procedures: None ordered   Follow-Up: At [facility], you and your health needs are our priority.  As part of our continuing mission to provide you with exceptional heart care, we have created designated Provider Care Teams.  These Care Teams include your primary Cardiologist (physician) and Advanced Practice Providers (APPs -  Physician Assistants and Nurse Practitioners) who all work together to provide you with the care you need, when you need it.  We recommend signing up for the patient portal called "MyChart".  Sign up information is provided on this After Visit Summary.  MyChart is used to connect with patients for Virtual Visits (Telemedicine).  Patients are able to view lab/test results, encounter notes, upcoming appointments, etc.  Non-urgent messages can be sent to your provider as well.   To learn more about what you can do with MyChart, go to [email or web address].    Your next appointment:   9 month(s)  The format for your next appointment:   In Person  Provider:   Jyl Heinz, MD   Other Instructions NA

## 2021-10-31 NOTE — Progress Notes (Signed)
Sent in request for DOS 11/06/2021 to [employer] [employer].  

## 2021-10-31 NOTE — Progress Notes (Signed)
[facility]  [address] [place],  [place]  [address] [phone]  Clinic Day:  09/17/2021  Referring physician:  Collier Salina, NP  HISTORY OF PRESENT ILLNESS:  The patient is a 76 y.o. male with metastatic tonsillar cancer, his disease has spread to his right glenoid process, multiple bones and lungs.  He recently completed palliative radiation to his right shoulder region.  He also comes in today to be evaluated for adding to his second cycle of carboplatin/infusional 5-fluorouracil/pembrolizumab.  Unfortunately, this gentleman's health has acutely declined.  His wife claims that his mentation has become more altered over the past week.  She assumes a significant component that this is due to pain medicines.  However, he has not taken his long-acting morphine for the past 2 days due to the fear this was heavily contributing to his altered mental status.  This gentleman's appetite is also significantly waned.  Clinically, there has been clear clinical deterioration.  His wife is also concerned about his feet which appear to be red and somewhat mottled.    PHYSICAL EXAM:  Blood pressure (!) 112/58, pulse (!) 104, temperature 97.6 F (36.4 C), resp. rate 16, height '5\' 9"'$  (1.753 m), weight 133 lb 1.6 oz (60.4 kg), SpO2 97 %. Wt Readings from Last 3 Encounters:  10/31/21 133 lb 1.6 oz (60.4 kg)  10/10/21 162 lb (73.5 kg)  10/06/21 157 lb (71.2 kg)   Body mass index is 19.66 kg/m. Performance status (ECOG): 3 Physical Exam Constitutional:      Appearance: He is ill-appearing.     Comments: A significant decline in his appearance, as he looks very gaunt/cachectic;  he is in a wheelchair  HENT:     Mouth/Throat:     Pharynx: Oropharyngeal exudate (mild/moderate thrush) present. No posterior oropharyngeal erythema.  Cardiovascular:     Rate and Rhythm: Normal rate and regular rhythm.     Heart sounds: No murmur heard.    No friction rub. No gallop.   Pulmonary:     Effort: Pulmonary effort is normal. No respiratory distress.     Breath sounds: Normal breath sounds. No wheezing, rhonchi or rales.  Abdominal:     General: Bowel sounds are normal. There is no distension.     Palpations: Abdomen is soft. There is no mass.     Tenderness: There is no abdominal tenderness.  Musculoskeletal:        General: No swelling.     Right upper arm: Edema present.     Right forearm: Edema present.     Right lower leg: No edema.     Left lower leg: No edema.  Lymphadenopathy:     Cervical: No cervical adenopathy.     Upper Body:     Right upper body: Axillary adenopathy (hard, firm lymph node palpated) present. No supraclavicular adenopathy.     Left upper body: No supraclavicular or axillary adenopathy.     Lower Body: No right inguinal adenopathy. No left inguinal adenopathy.  Skin:    General: Skin is warm.     Coloration: Skin is mottled. Skin is not jaundiced.     Findings: Erythema present. No lesion or rash.     Comments: His feet are swollen, with an erythematous/mottled appearance  Neurological:     General: No focal deficit present.     Mental Status: He is alert and oriented to person, place, and time. Mental status is at baseline.  Psychiatric:  Mood and Affect: Mood normal.        Behavior: Behavior normal.        Thought Content: Thought content normal.    ASSESSMENT & PLAN:  Assessment/Plan:  A 76 y.o. male who unfortunately has metastatic tonsillar cancer involving his right scapula, right subpectoral and axillary lymph nodes, and bilateral lungs.  In clinic today, I had a very candid discussion with the patient and his wife.  This gentleman's health has declined so quickly to where I do not feel comfortable with him proceeding with his second cycle of palliative chemotherapy/immunotherapy next week.  I will give him 1 month to recuperate physically before even potentially considering a 2nd cycle of treatment.  With the  rapid decline in his health, the patient understands that if things do not turn around, his life expectancy may not extend beyond the next 6 to 8 weeks.  I will have home health assess him to see what he may need at his house.  I will also have home health address his feet; he will also go to a wound clinic in the forthcoming days so they may also assess his feet for better skin care.  As he does have a mild degree of thrush, I will prescribe him 5 days of fluconazole.  I will also prescribe this gentleman Megace to hopefully improve his appetite over these next weeks.  Based upon his clinical condition today, this gentleman is hospice appropriate right now.  However, I do want to give him some time to at least try to get better off any form of palliative therapy.  However, I made sure the wife knew that if his condition declines over these next few days/weeks, I would not have a problem getting hospice started immediately.  Otherwise, we will see this patient back in 1 month for repeat clinical assessment.  The patient understands all the plans discussed today and is in agreement with them.   Raimundo Corbit Macarthur Critchley, MD

## 2021-10-31 NOTE — Telephone Encounter (Addendum)
Cody Benjamin, Home health nurse notified of Dr Bobby Rumpf' assessment and plan listed below.  ASSESSMENT & PLAN:  Assessment/Plan:  A 76 y.o. male who unfortunately has metastatic tonsillar cancer involving his right scapula, right subpectoral and axillary lymph nodes, and bilateral lungs.  In clinic today, I had a very candid discussion with the patient and his wife.  This gentleman's health has declined so quickly to where I do not feel comfortable with him proceeding with his second cycle of palliative chemotherapy/immunotherapy next week.  I will give him 1 month to recuperate physically before even potentially considering a 2nd cycle of treatment.  With the rapid decline in his health, the patient understands that if things do not turn around, his life expectancy may not extend beyond the next 6 to 8 weeks.  I will have home health assess him to see what he may need at his house.  I will also have home health address his feet; he will also go to a wound clinic in the forthcoming days so they may also assess his feet for better skin care.  As he does have a mild degree of thrush, I will prescribe him 5 days of fluconazole.  I will also prescribe this gentleman Megace to hopefully improve his appetite over these next weeks.  Based upon his clinical condition today, this gentleman is hospice appropriate right now.  However, I do want to give him some time to at least try to get better off any form of palliative therapy.  However, I made sure the wife knew that if his condition declines over these next few days/weeks, I would not have a problem getting hospice started immediately.  Otherwise, we will see this patient back in 1 month for repeat clinical assessment.  The patient understands all the plans discussed today and is in agreement with them.     [name], MD        Dr Bobby Rumpf documented in his office note about the patients feet:  Skin:    General: Skin is warm.     Coloration: Skin is mottled.  Skin is not jaundiced.     Findings: Erythema present. No lesion or rash.     Comments: His feet are swollen, with an erythematous/mottled appearance     Received call from [place], home health nurse. She went out to see pt yesterday and noticed he had new red rash w/blisters on his feet and ankles. These were not present when he was in hospital. Family also told her he was having confusion and hallucinations w/use of Morphine. They have stopped giving it to pt.  I sent above message to Dr Bobby Rumpf and Verner Chol, to address @ pt's appt today.

## 2021-11-01 LAB — T4: T4, Total: 8.3 ug/dL (ref 4.5–12.0)

## 2021-11-03 ENCOUNTER — Ambulatory Visit: Payer: PPO

## 2021-11-04 DIAGNOSIS — I509 Heart failure, unspecified: Secondary | ICD-10-CM | POA: Diagnosis not present

## 2021-11-04 DIAGNOSIS — C7800 Secondary malignant neoplasm of unspecified lung: Secondary | ICD-10-CM | POA: Diagnosis not present

## 2021-11-04 DIAGNOSIS — Z79899 Other long term (current) drug therapy: Secondary | ICD-10-CM | POA: Diagnosis not present

## 2021-11-04 DIAGNOSIS — K5903 Drug induced constipation: Secondary | ICD-10-CM | POA: Diagnosis not present

## 2021-11-04 DIAGNOSIS — C099 Malignant neoplasm of tonsil, unspecified: Secondary | ICD-10-CM | POA: Diagnosis not present

## 2021-11-04 DIAGNOSIS — Z681 Body mass index (BMI) 19 or less, adult: Secondary | ICD-10-CM | POA: Diagnosis not present

## 2021-11-04 DIAGNOSIS — I2699 Other pulmonary embolism without acute cor pulmonale: Secondary | ICD-10-CM | POA: Diagnosis not present

## 2021-11-07 ENCOUNTER — Other Ambulatory Visit: Payer: Self-pay

## 2021-11-08 ENCOUNTER — Other Ambulatory Visit: Payer: Self-pay | Admitting: Oncology

## 2021-11-26 NOTE — Progress Notes (Signed)
Sent in request for DOS 12/02/2021 to [employer].

## 2021-12-01 NOTE — Progress Notes (Deleted)
[facility]  [address] [place],  [place]  [address] [phone]  Clinic Day:  09/17/2021  Referring physician:  Collier Salina, NP  HISTORY OF PRESENT ILLNESS:  The patient is a 76 y.o. male with metastatic tonsillar cancer, his disease has spread to his right glenoid process, multiple bones and lungs.  He recently completed palliative radiation to his right shoulder region.  He also comes in today to be evaluated for adding to his second cycle of carboplatin/infusional 5-fluorouracil/pembrolizumab.  Unfortunately, this gentleman's health has acutely declined.  His wife claims that his mentation has become more altered over the past week.  She assumes a significant component that this is due to pain medicines.  However, he has not taken his long-acting morphine for the past 2 days due to the fear this was heavily contributing to his altered mental status.  This gentleman's appetite is also significantly waned.  Clinically, there has been clear clinical deterioration.  His wife is also concerned about his feet which appear to be red and somewhat mottled.    PHYSICAL EXAM:  There were no vitals taken for this visit. Wt Readings from Last 3 Encounters:  10/31/21 133 lb (60.3 kg)  10/31/21 133 lb 1.6 oz (60.4 kg)  10/10/21 162 lb (73.5 kg)   There is no height or weight on file to calculate BMI. Performance status (ECOG): 3 Physical Exam Constitutional:      Appearance: He is ill-appearing.     Comments: A significant decline in his appearance, as he looks very gaunt/cachectic;  he is in a wheelchair  HENT:     Mouth/Throat:     Pharynx: Oropharyngeal exudate (mild/moderate thrush) present. No posterior oropharyngeal erythema.  Cardiovascular:     Rate and Rhythm: Normal rate and regular rhythm.     Heart sounds: No murmur heard.    No friction rub. No gallop.  Pulmonary:     Effort: Pulmonary effort is normal. No respiratory distress.     Breath  sounds: Normal breath sounds. No wheezing, rhonchi or rales.  Abdominal:     General: Bowel sounds are normal. There is no distension.     Palpations: Abdomen is soft. There is no mass.     Tenderness: There is no abdominal tenderness.  Musculoskeletal:        General: No swelling.     Right upper arm: Edema present.     Right forearm: Edema present.     Right lower leg: No edema.     Left lower leg: No edema.  Lymphadenopathy:     Cervical: No cervical adenopathy.     Upper Body:     Right upper body: Axillary adenopathy (hard, firm lymph node palpated) present. No supraclavicular adenopathy.     Left upper body: No supraclavicular or axillary adenopathy.     Lower Body: No right inguinal adenopathy. No left inguinal adenopathy.  Skin:    General: Skin is warm.     Coloration: Skin is mottled. Skin is not jaundiced.     Findings: Erythema present. No lesion or rash.     Comments: His feet are swollen, with an erythematous/mottled appearance  Neurological:     General: No focal deficit present.     Mental Status: He is alert and oriented to person, place, and time. Mental status is at baseline.  Psychiatric:        Mood and Affect: Mood normal.        Behavior: Behavior  normal.        Thought Content: Thought content normal.    ASSESSMENT & PLAN:  Assessment/Plan:  A 76 y.o. male who unfortunately has metastatic tonsillar cancer involving his right scapula, right subpectoral and axillary lymph nodes, and bilateral lungs.  In clinic today, I had a very candid discussion with the patient and his wife.  This gentleman's health has declined so quickly to where I do not feel comfortable with him proceeding with his second cycle of palliative chemotherapy/immunotherapy next week.  I will give him 1 month to recuperate physically before even potentially considering a 2nd cycle of treatment.  With the rapid decline in his health, the patient understands that if things do not turn around, his  life expectancy may not extend beyond the next 6 to 8 weeks.  I will have home health assess him to see what he may need at his house.  I will also have home health address his feet; he will also go to a wound clinic in the forthcoming days so they may also assess his feet for better skin care.  As he does have a mild degree of thrush, I will prescribe him 5 days of fluconazole.  I will also prescribe this gentleman Megace to hopefully improve his appetite over these next weeks.  Based upon his clinical condition today, this gentleman is hospice appropriate right now.  However, I do want to give him some time to at least try to get better off any form of palliative therapy.  However, I made sure the wife knew that if his condition declines over these next few days/weeks, I would not have a problem getting hospice started immediately.  Otherwise, we will see this patient back in 1 month for repeat clinical assessment.  The patient understands all the plans discussed today and is in agreement with them.   Ameli Sangiovanni Macarthur Critchley, MD

## 2021-12-02 ENCOUNTER — Ambulatory Visit: Payer: PPO | Admitting: Oncology

## 2021-12-02 ENCOUNTER — Inpatient Hospital Stay: Payer: PPO

## 2022-01-07 DEATH — deceased

## 2022-02-06 ENCOUNTER — Ambulatory Visit: Payer: PPO | Admitting: Oncology

## 2022-02-06 ENCOUNTER — Other Ambulatory Visit: Payer: PPO

## 2022-10-06 NOTE — Telephone Encounter (Signed)
errr
